# Patient Record
Sex: Female | Born: 1935 | Race: White | Hispanic: No | Marital: Married | Smoking: Never smoker
Health system: Southern US, Community
[De-identification: ages and names within clinical notes are randomized; demographics above are authoritative.]

## PROBLEM LIST (undated history)

## (undated) DIAGNOSIS — I10 Essential (primary) hypertension: Secondary | ICD-10-CM

## (undated) DIAGNOSIS — E669 Obesity, unspecified: Secondary | ICD-10-CM

## (undated) DIAGNOSIS — E119 Type 2 diabetes mellitus without complications: Secondary | ICD-10-CM

## (undated) DIAGNOSIS — Z9221 Personal history of antineoplastic chemotherapy: Secondary | ICD-10-CM

## (undated) DIAGNOSIS — E7800 Pure hypercholesterolemia, unspecified: Secondary | ICD-10-CM

## (undated) DIAGNOSIS — Z923 Personal history of irradiation: Secondary | ICD-10-CM

## (undated) DIAGNOSIS — C50919 Malignant neoplasm of unspecified site of unspecified female breast: Secondary | ICD-10-CM

## (undated) HISTORY — DX: Pure hypercholesterolemia, unspecified: E78.00

## (undated) HISTORY — DX: Obesity, unspecified: E66.9

## (undated) HISTORY — DX: Malignant neoplasm of unspecified site of unspecified female breast: C50.919

## (undated) HISTORY — DX: Type 2 diabetes mellitus without complications: E11.9

## (undated) HISTORY — DX: Essential (primary) hypertension: I10

---
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
## 1981-09-18 HISTORY — PX: VAGINAL HYSTERECTOMY: SUR661

## 1996-09-18 DIAGNOSIS — C50919 Malignant neoplasm of unspecified site of unspecified female breast: Secondary | ICD-10-CM

## 1996-09-18 HISTORY — DX: Malignant neoplasm of unspecified site of unspecified female breast: C50.919

## 1996-09-18 HISTORY — PX: BREAST LUMPECTOMY: SHX2

## 1996-11-16 HISTORY — PX: BREAST LUMPECTOMY: SHX2

## 1997-11-24 ENCOUNTER — Ambulatory Visit (HOSPITAL_COMMUNITY): Admission: RE | Admit: 1997-11-24 | Discharge: 1997-11-24 | Payer: Self-pay | Admitting: Hematology & Oncology

## 1998-06-28 ENCOUNTER — Ambulatory Visit (HOSPITAL_COMMUNITY): Admission: RE | Admit: 1998-06-28 | Discharge: 1998-06-28 | Payer: Self-pay | Admitting: Surgery

## 1998-12-08 ENCOUNTER — Encounter: Payer: Self-pay | Admitting: Surgery

## 1998-12-08 ENCOUNTER — Ambulatory Visit (HOSPITAL_COMMUNITY): Admission: RE | Admit: 1998-12-08 | Discharge: 1998-12-08 | Payer: Self-pay | Admitting: Surgery

## 1999-01-18 ENCOUNTER — Ambulatory Visit (HOSPITAL_COMMUNITY): Admission: RE | Admit: 1999-01-18 | Discharge: 1999-01-18 | Payer: Self-pay | Admitting: Surgery

## 1999-01-18 ENCOUNTER — Encounter: Payer: Self-pay | Admitting: Surgery

## 1999-08-19 ENCOUNTER — Other Ambulatory Visit: Admission: RE | Admit: 1999-08-19 | Discharge: 1999-08-19 | Payer: Self-pay | Admitting: *Deleted

## 1999-08-24 ENCOUNTER — Encounter: Payer: Self-pay | Admitting: Hematology & Oncology

## 1999-08-24 ENCOUNTER — Ambulatory Visit (HOSPITAL_COMMUNITY): Admission: RE | Admit: 1999-08-24 | Discharge: 1999-08-24 | Payer: Self-pay | Admitting: Hematology & Oncology

## 1999-11-15 ENCOUNTER — Ambulatory Visit (HOSPITAL_COMMUNITY): Admission: RE | Admit: 1999-11-15 | Discharge: 1999-11-15 | Payer: Self-pay | Admitting: Gastroenterology

## 1999-11-15 ENCOUNTER — Encounter (INDEPENDENT_AMBULATORY_CARE_PROVIDER_SITE_OTHER): Payer: Self-pay | Admitting: Specialist

## 2000-05-02 ENCOUNTER — Ambulatory Visit (HOSPITAL_COMMUNITY): Admission: RE | Admit: 2000-05-02 | Discharge: 2000-05-02 | Payer: Self-pay | Admitting: Hematology & Oncology

## 2000-08-28 ENCOUNTER — Encounter: Payer: Self-pay | Admitting: Hematology & Oncology

## 2000-08-28 ENCOUNTER — Encounter: Admission: RE | Admit: 2000-08-28 | Discharge: 2000-08-28 | Payer: Self-pay | Admitting: Hematology & Oncology

## 2000-10-25 ENCOUNTER — Other Ambulatory Visit: Admission: RE | Admit: 2000-10-25 | Discharge: 2000-10-25 | Payer: Self-pay | Admitting: Internal Medicine

## 2000-11-07 ENCOUNTER — Encounter: Admission: RE | Admit: 2000-11-07 | Discharge: 2000-11-07 | Payer: Self-pay | Admitting: Hematology & Oncology

## 2000-11-07 ENCOUNTER — Encounter: Payer: Self-pay | Admitting: Hematology & Oncology

## 2000-11-13 ENCOUNTER — Encounter: Payer: Self-pay | Admitting: Hematology & Oncology

## 2000-11-13 ENCOUNTER — Encounter: Admission: RE | Admit: 2000-11-13 | Discharge: 2000-11-13 | Payer: Self-pay | Admitting: Hematology & Oncology

## 2001-09-02 ENCOUNTER — Encounter: Payer: Self-pay | Admitting: Hematology & Oncology

## 2001-09-02 ENCOUNTER — Encounter: Admission: RE | Admit: 2001-09-02 | Discharge: 2001-09-02 | Payer: Self-pay | Admitting: Hematology & Oncology

## 2002-01-27 ENCOUNTER — Encounter (INDEPENDENT_AMBULATORY_CARE_PROVIDER_SITE_OTHER): Payer: Self-pay | Admitting: Specialist

## 2002-01-27 ENCOUNTER — Ambulatory Visit (HOSPITAL_COMMUNITY): Admission: RE | Admit: 2002-01-27 | Discharge: 2002-01-27 | Payer: Self-pay | Admitting: Gastroenterology

## 2002-09-04 ENCOUNTER — Encounter: Admission: RE | Admit: 2002-09-04 | Discharge: 2002-09-04 | Payer: Self-pay | Admitting: Hematology & Oncology

## 2002-09-04 ENCOUNTER — Encounter: Payer: Self-pay | Admitting: Hematology & Oncology

## 2002-11-18 ENCOUNTER — Encounter: Payer: Self-pay | Admitting: Hematology & Oncology

## 2002-11-18 ENCOUNTER — Ambulatory Visit (HOSPITAL_COMMUNITY): Admission: RE | Admit: 2002-11-18 | Discharge: 2002-11-18 | Payer: Self-pay | Admitting: Hematology & Oncology

## 2003-09-07 ENCOUNTER — Encounter: Admission: RE | Admit: 2003-09-07 | Discharge: 2003-09-07 | Payer: Self-pay | Admitting: Hematology & Oncology

## 2004-09-28 ENCOUNTER — Encounter: Admission: RE | Admit: 2004-09-28 | Discharge: 2004-09-28 | Payer: Self-pay | Admitting: Internal Medicine

## 2004-10-13 ENCOUNTER — Encounter: Admission: RE | Admit: 2004-10-13 | Discharge: 2004-10-13 | Payer: Self-pay | Admitting: Hematology & Oncology

## 2004-11-11 ENCOUNTER — Ambulatory Visit: Payer: Self-pay | Admitting: Hematology & Oncology

## 2004-11-23 ENCOUNTER — Ambulatory Visit (HOSPITAL_COMMUNITY): Admission: RE | Admit: 2004-11-23 | Discharge: 2004-11-23 | Payer: Self-pay | Admitting: Hematology & Oncology

## 2005-02-21 ENCOUNTER — Encounter: Admission: RE | Admit: 2005-02-21 | Discharge: 2005-02-21 | Payer: Self-pay | Admitting: Internal Medicine

## 2005-05-12 ENCOUNTER — Ambulatory Visit: Payer: Self-pay | Admitting: Hematology & Oncology

## 2005-10-17 ENCOUNTER — Encounter: Admission: RE | Admit: 2005-10-17 | Discharge: 2005-10-17 | Payer: Self-pay | Admitting: Internal Medicine

## 2005-11-10 ENCOUNTER — Ambulatory Visit: Payer: Self-pay | Admitting: Hematology & Oncology

## 2006-01-17 ENCOUNTER — Encounter: Payer: Self-pay | Admitting: Surgery

## 2006-05-14 ENCOUNTER — Ambulatory Visit: Payer: Self-pay | Admitting: Hematology & Oncology

## 2006-05-16 LAB — CBC WITH DIFFERENTIAL/PLATELET
BASO%: 0.5 % (ref 0.0–2.0)
Basophils Absolute: 0 10*3/uL (ref 0.0–0.1)
EOS%: 3.9 % (ref 0.0–7.0)
HGB: 11.9 g/dL (ref 11.6–15.9)
LYMPH%: 31.8 % (ref 14.0–48.0)
MONO%: 9.8 % (ref 0.0–13.0)
NEUT#: 3.3 10*3/uL (ref 1.5–6.5)
NEUT%: 54 % (ref 39.6–76.8)
WBC: 6 10*3/uL (ref 3.9–10.0)
lymph#: 1.9 10*3/uL (ref 0.9–3.3)

## 2006-05-16 LAB — COMPREHENSIVE METABOLIC PANEL
Alkaline Phosphatase: 65 U/L (ref 39–117)
BUN: 15 mg/dL (ref 6–23)
CO2: 28 mEq/L (ref 19–32)
Creatinine, Ser: 0.89 mg/dL (ref 0.40–1.20)
Glucose, Bld: 101 mg/dL — ABNORMAL HIGH (ref 70–99)
Sodium: 140 mEq/L (ref 135–145)
Total Bilirubin: 0.3 mg/dL (ref 0.3–1.2)
Total Protein: 6.9 g/dL (ref 6.0–8.3)

## 2006-10-18 ENCOUNTER — Encounter: Admission: RE | Admit: 2006-10-18 | Discharge: 2006-10-18 | Payer: Self-pay | Admitting: Hematology & Oncology

## 2006-11-12 ENCOUNTER — Ambulatory Visit: Payer: Self-pay | Admitting: Hematology & Oncology

## 2006-11-14 LAB — CBC WITH DIFFERENTIAL/PLATELET
Basophils Absolute: 0.1 10*3/uL (ref 0.0–0.1)
Eosinophils Absolute: 0.3 10*3/uL (ref 0.0–0.5)
HGB: 13.6 g/dL (ref 11.6–15.9)
LYMPH%: 33.1 % (ref 14.0–48.0)
MCV: 89.1 fL (ref 81.0–101.0)
MONO#: 0.7 10*3/uL (ref 0.1–0.9)
NEUT#: 4.2 10*3/uL (ref 1.5–6.5)
Platelets: 250 10*3/uL (ref 145–400)
RBC: 4.3 10*6/uL (ref 3.70–5.32)
WBC: 7.8 10*3/uL (ref 3.9–10.0)

## 2006-11-14 LAB — COMPREHENSIVE METABOLIC PANEL
Albumin: 4.5 g/dL (ref 3.5–5.2)
BUN: 10 mg/dL (ref 6–23)
CO2: 24 mEq/L (ref 19–32)
Glucose, Bld: 116 mg/dL — ABNORMAL HIGH (ref 70–99)
Potassium: 4.4 mEq/L (ref 3.5–5.3)
Sodium: 139 mEq/L (ref 135–145)
Total Bilirubin: 0.5 mg/dL (ref 0.3–1.2)
Total Protein: 7.2 g/dL (ref 6.0–8.3)

## 2007-05-12 ENCOUNTER — Ambulatory Visit: Payer: Self-pay | Admitting: Hematology & Oncology

## 2007-11-01 ENCOUNTER — Encounter: Admission: RE | Admit: 2007-11-01 | Discharge: 2007-11-01 | Payer: Self-pay | Admitting: Hematology & Oncology

## 2007-11-04 ENCOUNTER — Ambulatory Visit: Payer: Self-pay | Admitting: Hematology & Oncology

## 2007-11-06 LAB — CBC WITH DIFFERENTIAL/PLATELET
Basophils Absolute: 0 10*3/uL (ref 0.0–0.1)
EOS%: 4.6 % (ref 0.0–7.0)
Eosinophils Absolute: 0.3 10*3/uL (ref 0.0–0.5)
HGB: 13.4 g/dL (ref 11.6–15.9)
LYMPH%: 33.5 % (ref 14.0–48.0)
MCH: 30.9 pg (ref 26.0–34.0)
MCV: 90 fL (ref 81.0–101.0)
MONO#: 0.6 10*3/uL (ref 0.1–0.9)
NEUT%: 53.7 % (ref 39.6–76.8)
RBC: 4.34 10*6/uL (ref 3.70–5.32)
RDW: 13.1 % (ref 11.3–14.5)

## 2007-11-06 LAB — COMPREHENSIVE METABOLIC PANEL
ALT: 31 U/L (ref 0–35)
AST: 30 U/L (ref 0–37)
BUN: 15 mg/dL (ref 6–23)
Calcium: 9.4 mg/dL (ref 8.4–10.5)
Chloride: 100 mEq/L (ref 96–112)
Creatinine, Ser: 0.83 mg/dL (ref 0.40–1.20)
Potassium: 4 mEq/L (ref 3.5–5.3)
Total Bilirubin: 0.4 mg/dL (ref 0.3–1.2)
Total Protein: 6.8 g/dL (ref 6.0–8.3)

## 2008-05-13 ENCOUNTER — Ambulatory Visit: Payer: Self-pay | Admitting: Hematology & Oncology

## 2008-05-14 LAB — CBC WITH DIFFERENTIAL (CANCER CENTER ONLY)
BASO#: 0 10*3/uL (ref 0.0–0.2)
EOS%: 5.9 % (ref 0.0–7.0)
Eosinophils Absolute: 0.3 10*3/uL (ref 0.0–0.5)
HGB: 13.4 g/dL (ref 11.6–15.9)
LYMPH%: 38.9 % (ref 14.0–48.0)
MCH: 30.5 pg (ref 26.0–34.0)
MCHC: 35 g/dL (ref 32.0–36.0)
MCV: 87 fL (ref 81–101)
MONO%: 7.4 % (ref 0.0–13.0)
RBC: 4.39 10*6/uL (ref 3.70–5.32)

## 2008-05-14 LAB — COMPREHENSIVE METABOLIC PANEL
ALT: 43 U/L — ABNORMAL HIGH (ref 0–35)
AST: 36 U/L (ref 0–37)
Albumin: 4.3 g/dL (ref 3.5–5.2)
Alkaline Phosphatase: 65 U/L (ref 39–117)
Calcium: 9.9 mg/dL (ref 8.4–10.5)
Creatinine, Ser: 0.82 mg/dL (ref 0.40–1.20)
Potassium: 4.7 mEq/L (ref 3.5–5.3)
Total Bilirubin: 0.4 mg/dL (ref 0.3–1.2)
Total Protein: 6.8 g/dL (ref 6.0–8.3)

## 2008-11-04 ENCOUNTER — Ambulatory Visit: Payer: Self-pay | Admitting: Hematology & Oncology

## 2008-11-05 ENCOUNTER — Ambulatory Visit (HOSPITAL_BASED_OUTPATIENT_CLINIC_OR_DEPARTMENT_OTHER): Admission: RE | Admit: 2008-11-05 | Discharge: 2008-11-05 | Payer: Self-pay | Admitting: Hematology & Oncology

## 2008-11-05 ENCOUNTER — Ambulatory Visit: Payer: Self-pay | Admitting: Diagnostic Radiology

## 2008-11-05 LAB — CBC WITH DIFFERENTIAL (CANCER CENTER ONLY)
BASO#: 0 10*3/uL (ref 0.0–0.2)
Eosinophils Absolute: 0.3 10*3/uL (ref 0.0–0.5)
HCT: 36.2 % (ref 34.8–46.6)
HGB: 11.9 g/dL (ref 11.6–15.9)
LYMPH%: 31 % (ref 14.0–48.0)
MCV: 90 fL (ref 81–101)
MONO#: 0.4 10*3/uL (ref 0.1–0.9)
NEUT%: 58.3 % (ref 39.6–80.0)
Platelets: 231 10*3/uL (ref 145–400)
RBC: 4.04 10*6/uL (ref 3.70–5.32)
WBC: 6.7 10*3/uL (ref 3.9–10.0)

## 2008-11-05 LAB — COMPREHENSIVE METABOLIC PANEL
AST: 39 U/L — ABNORMAL HIGH (ref 0–37)
Albumin: 4.3 g/dL (ref 3.5–5.2)
Calcium: 9.5 mg/dL (ref 8.4–10.5)
Glucose, Bld: 120 mg/dL — ABNORMAL HIGH (ref 70–99)
Potassium: 4.1 mEq/L (ref 3.5–5.3)
Sodium: 136 mEq/L (ref 135–145)
Total Protein: 6.9 g/dL (ref 6.0–8.3)

## 2008-12-02 ENCOUNTER — Encounter: Admission: RE | Admit: 2008-12-02 | Discharge: 2008-12-02 | Payer: Self-pay | Admitting: Hematology & Oncology

## 2009-05-05 ENCOUNTER — Ambulatory Visit: Payer: Self-pay | Admitting: Hematology & Oncology

## 2009-05-06 LAB — COMPREHENSIVE METABOLIC PANEL
AST: 32 U/L (ref 0–37)
Albumin: 4.3 g/dL (ref 3.5–5.2)
Chloride: 103 mEq/L (ref 96–112)
Creatinine, Ser: 0.85 mg/dL (ref 0.40–1.20)
Sodium: 139 mEq/L (ref 135–145)
Total Bilirubin: 0.4 mg/dL (ref 0.3–1.2)
Total Protein: 6.7 g/dL (ref 6.0–8.3)

## 2009-05-06 LAB — CBC WITH DIFFERENTIAL (CANCER CENTER ONLY)
Eosinophils Absolute: 0.3 10*3/uL (ref 0.0–0.5)
HGB: 13.2 g/dL (ref 11.6–15.9)
LYMPH#: 2.6 10*3/uL (ref 0.9–3.3)
MONO#: 0.4 10*3/uL (ref 0.1–0.9)
NEUT#: 3.4 10*3/uL (ref 1.5–6.5)
Platelets: 253 10*3/uL (ref 145–400)
RBC: 4.33 10*6/uL (ref 3.70–5.32)
WBC: 6.7 10*3/uL (ref 3.9–10.0)

## 2010-03-31 ENCOUNTER — Encounter: Admission: RE | Admit: 2010-03-31 | Discharge: 2010-03-31 | Payer: Self-pay | Admitting: Hematology & Oncology

## 2010-05-04 ENCOUNTER — Ambulatory Visit: Payer: Self-pay | Admitting: Hematology & Oncology

## 2010-05-05 LAB — CBC WITH DIFFERENTIAL (CANCER CENTER ONLY)
HCT: 38.1 % (ref 34.8–46.6)
MCHC: 34.1 g/dL (ref 32.0–36.0)
MCV: 91 fL (ref 81–101)
RBC: 4.21 10*6/uL (ref 3.70–5.32)
RDW: 11.3 % (ref 10.5–14.6)
WBC: 6.5 10*3/uL (ref 3.9–10.0)

## 2010-05-06 LAB — COMPREHENSIVE METABOLIC PANEL
ALT: 32 U/L (ref 0–35)
AST: 34 U/L (ref 0–37)
Alkaline Phosphatase: 60 U/L (ref 39–117)
BUN: 13 mg/dL (ref 6–23)
Chloride: 101 mEq/L (ref 96–112)
Creatinine, Ser: 0.91 mg/dL (ref 0.40–1.20)
Glucose, Bld: 97 mg/dL (ref 70–99)
Potassium: 4.1 mEq/L (ref 3.5–5.3)
Total Bilirubin: 0.4 mg/dL (ref 0.3–1.2)

## 2011-02-03 NOTE — Procedures (Signed)
[place]. [facility]  Patient:    Meghan Santos, Meghan Santos Visit Number: [id number] MRN: [id number]          Service Type: END Location: ENDO Attending Physician:  Rich Brave Dictated by:   Florencia Reasons, M.D. Proc. Date: 01/27/02 Admit Date:  01/27/2002   CC:         Rodrigo Ran, M.D.                           Procedure Report  PROCEDURE:  Upper endoscopy with biopsies.  SURGEON:  Florencia Reasons, M.D.  INDICATIONS:  A 76 year old female with mild anemia and recurring epigastric pain (which has subsequently resolved).  FINDINGS:  Small hiatal hernia.  Small gastric polyp.  DESCRIPTION OF PROCEDURE:  The nature, purpose, and risks of the procedure had been discussed with the patient who provided written consent.  Sedation was fentanyl 100 mcg and Versed 10 mg IV without arrhythmias or desaturation.  The Olympus small caliber adult video endoscope was passed under direct vision, entering the esophagus without difficulty.  The esophageal mucosa was normal. There was no evidence of reflux esophagitis, Barretts esophagus, varices, infection, or neoplasia.  There was a 3 cm hiatal hernia present.  On the greater curve aspect of the mid body of the stomach was a 3 mm raised nodule, probably representing an early gastric polyp which was biopsied once or twice.  No large polyps, cancer, colitis, or vascular malformations were observed.  Retroflexion in the cardia of the stomach was basically unremarkable.  The pylorus, duodenal bulb, and second duodenum looked normal including the duodenal mucosa, but random biopsies were obtained in view of the history of anemia to rule out celiac disease.  The scope was then removed from the patient who tolerated the procedure well and without apparent complication.  IMPRESSION: 1. No source of epigastric pain or mild anemia endoscopically evident. 2. No evidence of aspirin-induced gastropathy. 3. Small  gastric polyp, doubtful clinical significance. 4. Small to medium-sized hiatal hernia.  PLAN:  Await pathology on biopsies. Dictated by:   Florencia Reasons, M.D. Attending Physician:  Rich Brave DD:  01/27/02 TD:  01/28/02 Job: [id number]

## 2011-04-05 ENCOUNTER — Other Ambulatory Visit: Payer: Self-pay | Admitting: Hematology & Oncology

## 2011-04-05 DIAGNOSIS — Z1231 Encounter for screening mammogram for malignant neoplasm of breast: Secondary | ICD-10-CM

## 2011-04-06 ENCOUNTER — Ambulatory Visit
Admission: RE | Admit: 2011-04-06 | Discharge: 2011-04-06 | Disposition: A | Discharge status: Home / Self Care | Payer: Medicare Other | Source: Ambulatory Visit | Attending: Hematology & Oncology | Admitting: Hematology & Oncology

## 2011-04-06 DIAGNOSIS — Z1231 Encounter for screening mammogram for malignant neoplasm of breast: Secondary | ICD-10-CM

## 2011-04-27 ENCOUNTER — Encounter (HOSPITAL_BASED_OUTPATIENT_CLINIC_OR_DEPARTMENT_OTHER): Payer: Medicare Other | Admitting: Hematology & Oncology

## 2011-04-27 ENCOUNTER — Other Ambulatory Visit: Payer: Self-pay | Admitting: Hematology & Oncology

## 2011-04-27 DIAGNOSIS — C50919 Malignant neoplasm of unspecified site of unspecified female breast: Secondary | ICD-10-CM

## 2011-04-27 DIAGNOSIS — L989 Disorder of the skin and subcutaneous tissue, unspecified: Secondary | ICD-10-CM

## 2011-04-27 DIAGNOSIS — Z170 Estrogen receptor positive status [ER+]: Secondary | ICD-10-CM

## 2011-04-27 LAB — CBC WITH DIFFERENTIAL (CANCER CENTER ONLY)
BASO#: 0 10*3/uL (ref 0.0–0.2)
BASO%: 0.3 % (ref 0.0–2.0)
Eosinophils Absolute: 0.5 10*3/uL (ref 0.0–0.5)
HCT: 37 % (ref 34.8–46.6)
LYMPH#: 2.4 10*3/uL (ref 0.9–3.3)
MCH: 31.1 pg (ref 26.0–34.0)
MCV: 91 fL (ref 81–101)
MONO#: 0.5 10*3/uL (ref 0.1–0.9)
Platelets: 220 10*3/uL (ref 145–400)
RBC: 4.08 10*6/uL (ref 3.70–5.32)
RDW: 12.5 % (ref 11.1–15.7)

## 2011-04-27 LAB — COMPREHENSIVE METABOLIC PANEL
ALT: 20 U/L (ref 0–35)
AST: 30 U/L (ref 0–37)
BUN: 15 mg/dL (ref 6–23)
CO2: 29 mEq/L (ref 19–32)
Glucose, Bld: 105 mg/dL — ABNORMAL HIGH (ref 70–99)

## 2011-11-22 ENCOUNTER — Other Ambulatory Visit: Payer: Self-pay | Admitting: Gastroenterology

## 2011-11-22 ENCOUNTER — Other Ambulatory Visit (HOSPITAL_COMMUNITY): Payer: Self-pay | Admitting: *Deleted

## 2011-11-22 DIAGNOSIS — R059 Cough, unspecified: Secondary | ICD-10-CM

## 2011-11-22 DIAGNOSIS — R05 Cough: Secondary | ICD-10-CM

## 2011-11-23 ENCOUNTER — Encounter (INDEPENDENT_AMBULATORY_CARE_PROVIDER_SITE_OTHER): Payer: Medicare Other | Admitting: *Deleted

## 2011-11-23 DIAGNOSIS — R609 Edema, unspecified: Secondary | ICD-10-CM

## 2011-11-24 ENCOUNTER — Ambulatory Visit
Admission: RE | Admit: 2011-11-24 | Discharge: 2011-11-24 | Disposition: A | Discharge status: Home / Self Care | Payer: Medicare Other | Source: Ambulatory Visit | Attending: Gastroenterology | Admitting: Gastroenterology

## 2011-11-29 ENCOUNTER — Ambulatory Visit (HOSPITAL_COMMUNITY)
Admission: RE | Admit: 2011-11-29 | Discharge: 2011-11-29 | Disposition: A | Discharge status: Home / Self Care | Payer: Medicare Other | Source: Ambulatory Visit | Attending: Gastroenterology | Admitting: Gastroenterology

## 2011-11-29 DIAGNOSIS — K219 Gastro-esophageal reflux disease without esophagitis: Secondary | ICD-10-CM | POA: Insufficient documentation

## 2011-11-29 DIAGNOSIS — R059 Cough, unspecified: Secondary | ICD-10-CM

## 2011-11-29 DIAGNOSIS — R1310 Dysphagia, unspecified: Secondary | ICD-10-CM | POA: Insufficient documentation

## 2011-11-29 DIAGNOSIS — R05 Cough: Secondary | ICD-10-CM | POA: Insufficient documentation

## 2011-11-29 DIAGNOSIS — K449 Diaphragmatic hernia without obstruction or gangrene: Secondary | ICD-10-CM | POA: Insufficient documentation

## 2011-11-29 NOTE — Procedures (Signed)
Modified Barium Swallow Procedure Note Patient Details  Name: Meghan Santos MRN: [id number] Date of Birth: Feb 20, 1935  Today's Date: 11/29/2011  Past Medical History: No past medical history on file. Past Surgical History: No past surgical history on file.  HPI: 77 yo female with a h/o GERD and a hiatal hernia. She denies any h/o PNA or CVA. Pt reports that she is coughing on initial sips of thin liquid, but solid consistencies do not cause her any trouble. An MBS was scheduled as well as a barium swallow, which revealed mild reflux and a small hiatal hernia.  Recommendation/Prognosis  Clinical Impression: Pt presents with an oropharyngeal swallow that is age appropriate. A delay to the pyriform sinuses was observed with thin liquids, resulting in flash penetration, however no aspiration was observed and all penetrates were spontaneously cleared. Recommend a regular diet with thin liquids and reflux precautions give h/o GERD and hiatal hernia.  Recommendations: 1. Regular diet with thin liquids 2. Slow rate, small bites/sips 3. Meds whole in liquid 4. Alternate solids/liquids 5. Remain upright 30-60 minutes after meals   Maxcine Ham 11/29/2011, 4:18 PM   Maxcine Ham, SLP Student

## 2011-12-04 NOTE — Procedures (Unsigned)
DUPLEX DEEP VENOUS EXAM - LOWER EXTREMITY  INDICATION:  Bilateral lower extremity edema for 4 months.  HISTORY:  Edema:  Yes Trauma/Surgery:  No Pain:  No PE:  No Previous DVT:  No Anticoagulants:  Aspirin Other:  DUPLEX EXAM:               CFV   SFV   PopV  PTV    GSV               R  L  R  L  R  L  R   L  R  L Thrombosis    0  0  0  0  0  0  0   0  0  0 Spontaneous   +  +  +  +  +  +            + Phasic        +  +  +  +  +  +            + Augmentation  +  +  +  +  +  +  +   +  +  + Compressible  +  +  +  +  +  +  +   +  +  + Competent     +  +  +  +  +  +  +   +  +  0  Legend:  + - yes  o - no  p - partial  D - decreased  IMPRESSION: 1. No evidence of deep venous thrombosis or superficial venous     thrombus in the bilateral lower extremities. 2. There is mild reflux observed in the left great saphenous vein.   _____________________________ V. Charlena Cross, MD  LT/MEDQ  D:  11/23/2011  T:  11/23/2011  Job:  [id number]

## 2012-01-19 ENCOUNTER — Other Ambulatory Visit: Payer: Self-pay | Admitting: Gastroenterology

## 2012-04-29 ENCOUNTER — Ambulatory Visit (HOSPITAL_BASED_OUTPATIENT_CLINIC_OR_DEPARTMENT_OTHER): Payer: Medicare Other | Admitting: Hematology & Oncology

## 2012-04-29 ENCOUNTER — Other Ambulatory Visit (HOSPITAL_BASED_OUTPATIENT_CLINIC_OR_DEPARTMENT_OTHER): Payer: Medicare Other | Admitting: Lab

## 2012-04-29 VITALS — BP 121/72 | HR 67 | Temp 97.0°F | Resp 18 | Ht 66.0 in | Wt 197.0 lb

## 2012-04-29 DIAGNOSIS — M810 Age-related osteoporosis without current pathological fracture: Secondary | ICD-10-CM

## 2012-04-29 DIAGNOSIS — Z853 Personal history of malignant neoplasm of breast: Secondary | ICD-10-CM

## 2012-04-29 DIAGNOSIS — C50919 Malignant neoplasm of unspecified site of unspecified female breast: Secondary | ICD-10-CM

## 2012-04-29 LAB — CBC WITH DIFFERENTIAL (CANCER CENTER ONLY)
BASO%: 0.4 % (ref 0.0–2.0)
Eosinophils Absolute: 0.7 10*3/uL — ABNORMAL HIGH (ref 0.0–0.5)
HCT: 38.7 % (ref 34.8–46.6)
LYMPH#: 2.6 10*3/uL (ref 0.9–3.3)
MONO#: 0.7 10*3/uL (ref 0.1–0.9)
Platelets: 248 10*3/uL (ref 145–400)
RBC: 4.33 10*6/uL (ref 3.70–5.32)
RDW: 13.6 % (ref 11.1–15.7)
WBC: 7.7 10*3/uL (ref 3.9–10.0)

## 2012-04-29 NOTE — Progress Notes (Signed)
This office note has been dictated.

## 2012-04-30 LAB — VITAMIN D 25 HYDROXY (VIT D DEFICIENCY, FRACTURES): Vit D, 25-Hydroxy: 48 ng/mL (ref 30–89)

## 2012-04-30 LAB — COMPREHENSIVE METABOLIC PANEL
ALT: 31 U/L (ref 0–35)
BUN: 11 mg/dL (ref 6–23)
CO2: 28 mEq/L (ref 19–32)
Calcium: 9.9 mg/dL (ref 8.4–10.5)
Chloride: 103 mEq/L (ref 96–112)
Creatinine, Ser: 0.82 mg/dL (ref 0.50–1.10)
Glucose, Bld: 97 mg/dL (ref 70–99)

## 2012-04-30 NOTE — Progress Notes (Signed)
CC:   Mark A. Perini, M.D. Frederick A. Worthy Rancher, M.D.  DIAGNOSIS:  Stage IIB (T2 N1 M0) lobular carcinoma of the left breast.  CURRENT THERAPY:  Observation.  INTERIM HISTORY:  Meghan Santos comes in for her yearly followup.  She is doing quite well.  She has had no problems since we last saw her.  She does have diabetes.  She says this is fairly well-controlled.  She has not had a mammogram yet.  She really does not have a lot of faith in mammograms.  I told her that she needed to have her mammogram done.  She has had some slight leg swelling.  This comes and goes.  There has been no change in bowel or bladder habits.  She has had no bleeding. She has had no cough or shortness of breath.  PHYSICAL EXAMINATION:  This is a well-developed, well-nourished white female in no obvious distress.  Vital signs:  Temperature of 97, pulse 67, respiratory rate 18, blood pressure 121/72.  Weight is 197.  Head and neck:  Normocephalic, atraumatic skull.  There are no ocular or oral lesions.  There are no palpable cervical or supraclavicular lymph nodes. Lungs:  Clear bilaterally.  Cardiac:  Regular rate and rhythm with a normal S1 and S2.  There are no murmurs, rubs or bruits.  Abdomen:  Soft with good bowel sounds.  There is no palpable abdominal mass.  There is no fluid wave.  There is no palpable hepatosplenomegaly.  Back:  Right breast with no masses, edema or erythema.  There is no right axillary adenopathy.  Left breast shows well-healed lumpectomy at the 5 o'clock position.  This is right at the areola rim.  There is some slight swelling and fullness at the lumpectomy site.  No distinct mass is noted at the lumpectomy site.  There is no mass in the left breast.  There is no left axillary adenopathy.  Back:  Some mild kyphosis.  No tenderness is noted over the spine, ribs or hips.  Extremities:  No clubbing, cyanosis or edema.  Neurologic:  No focal neurological deficit.  Skin: No rashes,  ecchymosis or petechia.  LABORATORY STUDIES:  White cell count is 7.7, hemoglobin 12.7, hematocrit 38.7, platelet count 248.  IMPRESSION:  Meghan Santos is a 77 year old white female with stage IIB breast cancer.  She was treated back in 1998.  She did very, very well. She had 1 positive lymph node.  She was on Arimidex that was completed back in 2008.  We will go ahead and plan to get her back in 1 year.    ______________________________ Josph Macho, M.D. PRE/MEDQ  D:  04/29/2012  T:  04/30/2012  Job:  [id number]

## 2012-06-24 ENCOUNTER — Other Ambulatory Visit: Payer: Self-pay | Admitting: Hematology & Oncology

## 2012-06-24 DIAGNOSIS — Z1231 Encounter for screening mammogram for malignant neoplasm of breast: Secondary | ICD-10-CM

## 2012-07-02 ENCOUNTER — Ambulatory Visit
Admission: RE | Admit: 2012-07-02 | Discharge: 2012-07-02 | Disposition: A | Discharge status: Home / Self Care | Payer: Medicare Other | Source: Ambulatory Visit | Attending: Hematology & Oncology | Admitting: Hematology & Oncology

## 2012-07-02 DIAGNOSIS — Z1231 Encounter for screening mammogram for malignant neoplasm of breast: Secondary | ICD-10-CM

## 2013-04-14 ENCOUNTER — Telehealth: Payer: Self-pay | Admitting: Hematology & Oncology

## 2013-04-14 NOTE — Telephone Encounter (Signed)
Patient called and cx 04/28/13 and resch for 05/12/13

## 2013-04-28 ENCOUNTER — Other Ambulatory Visit: Payer: Medicare Other | Admitting: Lab

## 2013-04-28 ENCOUNTER — Ambulatory Visit: Payer: Medicare Other | Admitting: Hematology & Oncology

## 2013-05-12 ENCOUNTER — Ambulatory Visit (HOSPITAL_BASED_OUTPATIENT_CLINIC_OR_DEPARTMENT_OTHER): Payer: Medicare Other | Admitting: Hematology & Oncology

## 2013-05-12 ENCOUNTER — Ambulatory Visit (HOSPITAL_BASED_OUTPATIENT_CLINIC_OR_DEPARTMENT_OTHER): Payer: Medicare Other | Admitting: Lab

## 2013-05-12 DIAGNOSIS — Z853 Personal history of malignant neoplasm of breast: Secondary | ICD-10-CM

## 2013-05-12 DIAGNOSIS — C50912 Malignant neoplasm of unspecified site of left female breast: Secondary | ICD-10-CM

## 2013-05-12 LAB — CBC WITH DIFFERENTIAL (CANCER CENTER ONLY)
BASO#: 0 10*3/uL (ref 0.0–0.2)
BASO%: 0.5 % (ref 0.0–2.0)
EOS%: 3.8 % (ref 0.0–7.0)
HGB: 12.2 g/dL (ref 11.6–15.9)
LYMPH#: 3 10*3/uL (ref 0.9–3.3)
MCHC: 32.6 g/dL (ref 32.0–36.0)
MONO#: 0.6 10*3/uL (ref 0.1–0.9)
NEUT#: 3.5 10*3/uL (ref 1.5–6.5)
WBC: 7.4 10*3/uL (ref 3.9–10.0)

## 2013-05-12 LAB — CMP (CANCER CENTER ONLY)
AST: 60 U/L — ABNORMAL HIGH (ref 11–38)
Alkaline Phosphatase: 56 U/L (ref 26–84)
BUN, Bld: 10 mg/dL (ref 7–22)
Creat: 0.9 mg/dl (ref 0.6–1.2)
Total Bilirubin: 0.5 mg/dl (ref 0.20–1.60)

## 2013-05-12 NOTE — Progress Notes (Signed)
This office note has been dictated.

## 2013-05-13 NOTE — Progress Notes (Signed)
CC:   Mark A. Perini, M.D. Frederick A. Worthy Rancher, M.D.  DIAGNOSIS:  Stage IIB (T2 N1 M0) lobular carcinoma of the left breast.  CURRENT THERAPY:  Observation.  INTERIM HISTORY:  Ms. Benda comes in for followup.  She is doing well. We see her yearly.  She is slowing down a little bit.  She has lost a little weight.  She is happy about this.  She lost a little more weight.  She and her family will be going to the beach over Labor Day.  They always do this.  Overall, she has been doing fairly well.  She does have diabetes.  She says this has not been much of a problem for her.  She has had no problems with pain.  There has been no issues with cough or shortness of breath.  She has had no rashes.  She does not have a lot of faith in mammograms, although she does get these done.  Her last mammogram was done back in October 2013.  This looked fine with no suspicious calcifications.  PHYSICAL EXAMINATION:  General:  This is a well-developed, well- nourished white female in no obvious distress. Vital signs:  Temperature 98, pulse 69, respiratory rate 16, blood pressure 128/53.  Weight is 188. Head and neck:  Shows normocephalic, atraumatic skull.  There are no ocular or oral lesions.  There are no palpable cervical or supraclavicular lymph nodes. Lungs:  Clear bilaterally. Cardiac:  Regular rate and rhythm with normal S1 and S2.  There are no murmurs, rubs or bruits. Abdomen:  Soft.  She has good bowel sounds.  There is no palpable abdominal mass.  There is no palpable hepatosplenomegaly. Breasts:  Show right breast with no masses, edema or erythema.  There is no right axillary adenopathy.  Left breast shows well-healed lumpectomy in the 5 o'clock position.  There is no distinct mass in the right breast.  There is no right axillary adenopathy. Back:  Shows no kyphosis.  There is no tenderness over the spine, ribs, or hips.  Extremities:  Show no clubbing, cyanosis or edema.   No lymphedema is noted in the right arm. Skin:  Shows no rashes, ecchymosis, or petechia.  LABORATORY STUDIES:  White cell count is 7.4, hemoglobin 12.2, hematocrit 37.4, platelet count 227.  IMPRESSION:  Ms. Samad is a very charming 78 year old white female with history of stage IIB lobular carcinoma of the left breast.  This was diagnosed back in 1998.  She had a positive lymph node.  She underwent systemic chemotherapy.  She was then placed on Arimidex.  She complete Arimidex in 2008.  I do not see any evidence of recurrence.  As such, we will plan to get her back in 1 year.  I do not see a need for any additional scans.    ______________________________ Josph Macho, M.D. PRE/MEDQ  D:  05/12/2013  T:  05/13/2013  Job:  [id number]

## 2013-05-15 ENCOUNTER — Telehealth: Payer: Self-pay | Admitting: Oncology

## 2013-05-15 NOTE — Telephone Encounter (Addendum)
Message copied by Lacie Draft on Thu May 15, 2013  4:49 PM ------      Message from: Arlan Organ R      Created: Wed May 14, 2013  9:08 PM       Call - labs look ok!!  Liver tests mildly elevated - likely due to medications.  Need your family MD to f/u with this.  Pete ------Left message on voice mail.

## 2013-07-28 ENCOUNTER — Other Ambulatory Visit: Payer: Self-pay

## 2013-07-28 DIAGNOSIS — Z1231 Encounter for screening mammogram for malignant neoplasm of breast: Secondary | ICD-10-CM

## 2013-08-27 ENCOUNTER — Ambulatory Visit
Admission: RE | Admit: 2013-08-27 | Discharge: 2013-08-27 | Disposition: A | Discharge status: Home / Self Care | Payer: Medicare Other | Source: Ambulatory Visit

## 2013-08-27 DIAGNOSIS — Z1231 Encounter for screening mammogram for malignant neoplasm of breast: Secondary | ICD-10-CM

## 2014-05-13 ENCOUNTER — Other Ambulatory Visit: Payer: Self-pay | Admitting: *Deleted

## 2014-05-13 DIAGNOSIS — C50912 Malignant neoplasm of unspecified site of left female breast: Secondary | ICD-10-CM

## 2014-05-14 ENCOUNTER — Encounter: Payer: Self-pay | Admitting: Hematology & Oncology

## 2014-05-14 ENCOUNTER — Other Ambulatory Visit (HOSPITAL_BASED_OUTPATIENT_CLINIC_OR_DEPARTMENT_OTHER): Payer: Medicare Other | Admitting: Lab

## 2014-05-14 ENCOUNTER — Ambulatory Visit (HOSPITAL_BASED_OUTPATIENT_CLINIC_OR_DEPARTMENT_OTHER): Payer: Medicare Other | Admitting: Hematology & Oncology

## 2014-05-14 VITALS — BP 122/74 | HR 75 | Temp 97.0°F | Resp 14 | Ht 65.0 in | Wt 193.0 lb

## 2014-05-14 DIAGNOSIS — Z853 Personal history of malignant neoplasm of breast: Secondary | ICD-10-CM

## 2014-05-14 DIAGNOSIS — C50912 Malignant neoplasm of unspecified site of left female breast: Secondary | ICD-10-CM

## 2014-05-14 LAB — CBC WITH DIFFERENTIAL (CANCER CENTER ONLY)
BASO#: 0 10*3/uL (ref 0.0–0.2)
BASO%: 0.4 % (ref 0.0–2.0)
EOS ABS: 0.2 10*3/uL (ref 0.0–0.5)
EOS%: 3 % (ref 0.0–7.0)
HCT: 36 % (ref 34.8–46.6)
HGB: 11.8 g/dL (ref 11.6–15.9)
LYMPH#: 2.8 10*3/uL (ref 0.9–3.3)
LYMPH%: 36.5 % (ref 14.0–48.0)
MCH: 29.2 pg (ref 26.0–34.0)
MCHC: 32.8 g/dL (ref 32.0–36.0)
MCV: 89 fL (ref 81–101)
MONO#: 0.7 10*3/uL (ref 0.1–0.9)
MONO%: 9 % (ref 0.0–13.0)
NEUT%: 51.1 % (ref 39.6–80.0)
NEUTROS ABS: 3.9 10*3/uL (ref 1.5–6.5)
PLATELETS: 265 10*3/uL (ref 145–400)
RBC: 4.04 10*6/uL (ref 3.70–5.32)
RDW: 13.7 % (ref 11.1–15.7)
WBC: 7.6 10*3/uL (ref 3.9–10.0)

## 2014-05-15 LAB — COMPREHENSIVE METABOLIC PANEL
ALBUMIN: 4.3 g/dL (ref 3.5–5.2)
ALT: 46 U/L — AB (ref 0–35)
AST: 54 U/L — AB (ref 0–37)
Alkaline Phosphatase: 63 U/L (ref 39–117)
BILIRUBIN TOTAL: 0.3 mg/dL (ref 0.2–1.2)
BUN: 10 mg/dL (ref 6–23)
CO2: 25 mEq/L (ref 19–32)
Calcium: 9.5 mg/dL (ref 8.4–10.5)
Chloride: 100 mEq/L (ref 96–112)
Creatinine, Ser: 0.78 mg/dL (ref 0.50–1.10)
Glucose, Bld: 154 mg/dL — ABNORMAL HIGH (ref 70–99)
POTASSIUM: 4.1 meq/L (ref 3.5–5.3)
Sodium: 136 mEq/L (ref 135–145)
TOTAL PROTEIN: 7 g/dL (ref 6.0–8.3)

## 2014-05-15 LAB — VITAMIN D 25 HYDROXY (VIT D DEFICIENCY, FRACTURES): Vit D, 25-Hydroxy: 48 ng/mL (ref 30–89)

## 2014-05-18 NOTE — Progress Notes (Signed)
Hematology and Oncology Follow Up Visit  Meghan Santos [id number] Apr 17, 1935 79 y.o. 05/18/2014   Principle Diagnosis:  Stage IIB (T2 N1 M0) lobular carcinoma of the left breast.  Current Therapy:    Observation     Interim History:  Meghan Santos is back for her yearly followup. She is very tired Ahmed Prima is some sludge today. I am very grateful for that.  She's been doing okay. Her blood sugars I think are on the high side., Sure how aggressive she is trying to help with those.  She did have cataract surgery back in March. There was a well well with this.  She's had no problems with bone pain. She's had no problems with changes in bowel or bladder habits. She's had no leg swelling. There's been no rashes. She's had no change in medications.  Her last mammogram was done in December of last year. Everything looked okay.  Medications: Current outpatient prescriptions:aspirin 81 MG tablet, Take 81 mg by mouth., Disp: , Rfl: ;  benazepril-hydrochlorthiazide (LOTENSIN HCT) 10-12.5 MG per tablet, daily. , Disp: , Rfl: ;  Biotin 5000 MCG TABS, Take by mouth every morning., Disp: , Rfl: ;  Cholecalciferol (VITAMIN D) 2000 UNITS tablet, Take 2,000 Units by mouth daily., Disp: , Rfl: ;  Coenzyme Q10 (CO Q-10) 100 MG CAPS, Take by mouth every morning., Disp: , Rfl:  CRESTOR 20 MG tablet, 20 mg daily. , Disp: , Rfl: ;  Cyanocobalamin (VITAMIN B 12 PO), Take by mouth every morning., Disp: , Rfl: ;  diltiazem (TIAZAC) 240 MG 24 hr capsule, daily. , Disp: , Rfl: ;  levothyroxine (SYNTHROID, LEVOTHROID) 125 MCG tablet, 125 mcg daily. , Disp: , Rfl: ;  LORazepam (ATIVAN) 1 MG tablet, at bedtime. , Disp: , Rfl:  metformin (FORTAMET) 1000 MG (OSM) 24 hr tablet, Take 1,000 mg by mouth daily with breakfast. , Disp: , Rfl: ;  Multiple Vitamin (MULTI-VITAMIN DAILY PO), Take by mouth every morning., Disp: , Rfl: ;  venlafaxine XR (EFFEXOR-XR) 150 MG 24 hr capsule, Take 150 mg by mouth daily with breakfast., Disp: ,  Rfl:   Allergies: No Known Allergies  Past Medical History, Surgical history, Social history, and Family History were reviewed and updated.  Review of Systems: As above  Physical Exam:  height is 5\' 5"  (1.651 m) and weight is 193 lb (87.544 kg). Her oral temperature is 97 F (36.1 C). Her blood pressure is 122/74 and her pulse is 75. Her respiration is 14.   Well-developed and well-nourished white female. Head and neck exam shows no ocular or oral lesions. Lungs are clear. Cardiac exam regular in rhythm with no murmurs rubs or bruits. Breast exam shows right breast with no masses, edema or erythema. There is no right axillary adenopathy. Left breast shows a well-healed lumpectomy the 5:00 position. There's some slight retraction of the left breast from radiation surgery. Some firmness is noted about the lumpectomy. No distinct masses noted. There is no right axillary adenopathy. Abdomen is soft. She's good bowel sounds. There is no palpable liver or spleen tip. Back exam shows no kyphosis. No tenderness is noted over the spine, ribs or hips. Extremities shows no clubbing, cyanosis or edema. Neurological exam shows no focal neurological deficits.  Lab Results  Component Value Date   WBC 7.6 05/14/2014   HGB 11.8 05/14/2014   HCT 36.0 05/14/2014   MCV 89 05/14/2014   PLT 265 05/14/2014     Chemistry  Component Value Date/Time   NA 136 05/14/2014 1428   NA 137 05/12/2013 1332   K 4.1 05/14/2014 1428   K 4.2 05/12/2013 1332   CL 100 05/14/2014 1428   CL 97* 05/12/2013 1332   CO2 25 05/14/2014 1428   CO2 28 05/12/2013 1332   BUN 10 05/14/2014 1428   BUN 10 05/12/2013 1332   CREATININE 0.78 05/14/2014 1428   CREATININE 0.9 05/12/2013 1332      Component Value Date/Time   CALCIUM 9.5 05/14/2014 1428   CALCIUM 9.4 05/12/2013 1332   ALKPHOS 63 05/14/2014 1428   ALKPHOS 56 05/12/2013 1332   AST 54* 05/14/2014 1428   AST 60* 05/12/2013 1332   ALT 46* 05/14/2014 1428   ALT 54* 05/12/2013 1332    BILITOT 0.3 05/14/2014 1428   BILITOT 0.50 05/12/2013 1332         Impression and Plan: Meghan Santos is 79 year old white female with a history of stage IIb lobular carcinoma the left breast. She was diagnosed back in 1998. She underwent systemic chemotherapy followed by Arimidex. She had radiation therapy.  She completed Arimidex when 2008.  I do not see any evidence of recurrent disease.  We will go ahead and plan to get her back in one or year.   [name], MD 8/31/20157:27 AM

## 2014-06-17 ENCOUNTER — Other Ambulatory Visit: Payer: Self-pay | Admitting: Dermatology

## 2014-07-28 ENCOUNTER — Other Ambulatory Visit: Payer: Self-pay

## 2014-07-28 DIAGNOSIS — Z1231 Encounter for screening mammogram for malignant neoplasm of breast: Secondary | ICD-10-CM

## 2014-07-28 DIAGNOSIS — Z853 Personal history of malignant neoplasm of breast: Secondary | ICD-10-CM

## 2014-08-28 ENCOUNTER — Ambulatory Visit
Admission: RE | Admit: 2014-08-28 | Discharge: 2014-08-28 | Disposition: A | Discharge status: Home / Self Care | Payer: Medicare Other | Source: Ambulatory Visit

## 2014-08-28 DIAGNOSIS — Z853 Personal history of malignant neoplasm of breast: Secondary | ICD-10-CM

## 2014-08-28 DIAGNOSIS — Z1231 Encounter for screening mammogram for malignant neoplasm of breast: Secondary | ICD-10-CM

## 2014-08-31 ENCOUNTER — Telehealth: Payer: Self-pay | Admitting: Cardiovascular Disease

## 2014-08-31 NOTE — Telephone Encounter (Signed)
Received records from [facility] (Dr Crist Infante) for appointment on 09/03/14 with Dr Sallyanne Kuster.  Records given to [facility] (medical records) for Dr Croitoru's schedule on 09/03/14.  lp

## 2014-09-03 ENCOUNTER — Ambulatory Visit (INDEPENDENT_AMBULATORY_CARE_PROVIDER_SITE_OTHER): Payer: Medicare Other | Admitting: Cardiovascular Disease

## 2014-09-03 ENCOUNTER — Encounter: Payer: Self-pay | Admitting: Cardiovascular Disease

## 2014-09-03 VITALS — BP 112/72 | HR 75 | Resp 18 | Ht 65.0 in | Wt 192.2 lb

## 2014-09-03 DIAGNOSIS — I10 Essential (primary) hypertension: Secondary | ICD-10-CM

## 2014-09-03 DIAGNOSIS — R0609 Other forms of dyspnea: Secondary | ICD-10-CM

## 2014-09-03 DIAGNOSIS — E669 Obesity, unspecified: Secondary | ICD-10-CM

## 2014-09-03 DIAGNOSIS — E7800 Pure hypercholesterolemia, unspecified: Secondary | ICD-10-CM

## 2014-09-03 DIAGNOSIS — R0600 Dyspnea, unspecified: Secondary | ICD-10-CM

## 2014-09-03 NOTE — Patient Instructions (Signed)
Your physician has requested that you have an echocardiogram. Echocardiography is a painless test that uses sound waves to create images of your heart. It provides your doctor with information about the size and shape of your heart and how well your heart's chambers and valves are working. This procedure takes approximately one hour. There are no restrictions for this procedure.  Your physician has requested that you have en exercise stress myoview. For further information please visit [email or web address]. Please follow instruction sheet, as given.  Dr. Sallyanne Kuster recommends that you schedule a follow-up appointment in: after testing is complete to go over your test result.  We will call you with the results if there is a delay in your appointment with Dr. Sallyanne Kuster.

## 2014-09-05 ENCOUNTER — Encounter: Payer: Self-pay | Admitting: Cardiovascular Disease

## 2014-09-05 DIAGNOSIS — E669 Obesity, unspecified: Secondary | ICD-10-CM

## 2014-09-05 DIAGNOSIS — R0609 Other forms of dyspnea: Secondary | ICD-10-CM

## 2014-09-05 DIAGNOSIS — R0600 Dyspnea, unspecified: Secondary | ICD-10-CM | POA: Insufficient documentation

## 2014-09-05 DIAGNOSIS — E7800 Pure hypercholesterolemia, unspecified: Secondary | ICD-10-CM

## 2014-09-05 DIAGNOSIS — I10 Essential (primary) hypertension: Secondary | ICD-10-CM | POA: Insufficient documentation

## 2014-09-05 HISTORY — DX: Obesity, unspecified: E66.9

## 2014-09-05 HISTORY — DX: Pure hypercholesterolemia, unspecified: E78.00

## 2014-09-05 HISTORY — DX: Essential (primary) hypertension: I10

## 2014-09-05 NOTE — Progress Notes (Signed)
Patient ID: Meghan Santos, female   DOB: 16-Jul-1935, 79 y.o.   MRN: [id number]     Reason for office visit Dyspnea  Mrs. Niehaus has no known cardiac illness, but has numerous risk factors including type 2 DM, hyperlipidemia and obesity. She has always been sedentary, but over the last year has noticed worsened dyspnea NYHA class II - one flight of stairs).  In 1998 she had left breast cancer and had surgery, radiation therapy and chemotherapy with the "red devil" (adriamycin). She does not recall having echo or other cardiac function monitoring during or after chemo and states that she asked to have "double dose" chemo to shorten the duration of therapy.  She denies exertional chest pain. She has had swelling in her legs "forever". She has occasional meal related heartburn.  No Known Allergies  Current Outpatient Prescriptions  Medication Sig Dispense Refill  . aspirin 81 MG tablet Take 81 mg by mouth.    . benazepril-hydrochlorthiazide (LOTENSIN HCT) 10-12.5 MG per tablet daily.     . Biotin 5000 MCG TABS Take by mouth every morning.    . Cholecalciferol (VITAMIN D) 2000 UNITS tablet Take 2,000 Units by mouth daily.    . Coenzyme Q10 (CO Q-10) 100 MG CAPS Take by mouth every morning.    Marland Kitchen CRESTOR 20 MG tablet 20 mg daily.     . Cyanocobalamin (VITAMIN B 12 PO) Take by mouth every morning.    . diltiazem (TIAZAC) 240 MG 24 hr capsule daily.     Marland Kitchen levothyroxine (SYNTHROID, LEVOTHROID) 125 MCG tablet 125 mcg daily.     Marland Kitchen linagliptin (TRADJENTA) 5 MG TABS tablet Take 5 mg by mouth daily.    Marland Kitchen LORazepam (ATIVAN) 1 MG tablet at bedtime.     . metformin (FORTAMET) 1000 MG (OSM) 24 hr tablet Take 1,000 mg by mouth daily with breakfast.     . Multiple Vitamin (MULTI-VITAMIN DAILY PO) Take by mouth every morning.    . venlafaxine XR (EFFEXOR-XR) 150 MG 24 hr capsule Take 150 mg by mouth daily with breakfast.     No current facility-administered medications for this visit.    Past Medical  History  Diagnosis Date  . Breast cancer 1998    left breast with radiation and chemo  . Hypertension   . Hypercholesterolemia   . Diabetes   . Essential hypertension 09/05/2014  . Hypercholesteremia 09/05/2014  . Mild obesity 09/05/2014    Past Surgical History  Procedure Laterality Date  . Breast lumpectomy  1998    left breast  . Vaginal hysterectomy  1983  . Tonsillectomy  1958    Family History  Problem Relation Age of Onset  . Heart attack Mother   . Diabetes Brother   . Cancer Sister     colon  . Dementia Father   . Alcoholism Brother     History   Social History  . Marital Status: Married    Spouse Name: N/A    Number of Children: N/A  . Years of Education: N/A   Occupational History  . Not on file.   Social History Main Topics  . Smoking status: Never Smoker   . Smokeless tobacco: Never Used     Comment: never used tobacco  . Alcohol Use: 0.0 oz/week    0 Not specified per week     Comment: occas.  . Drug Use: No  . Sexual Activity: Not on file   Other Topics Concern  . Not on file  Social History Narrative    Review of systems: The patient specifically denies any chest pain with exertion, dyspnea at rest, orthopnea, paroxysmal nocturnal dyspnea, syncope, palpitations, focal neurological deficits, intermittent claudication, lower extremity edema, unexplained weight gain, cough, hemoptysis or wheezing.  The patient also denies abdominal pain, nausea, vomiting, dysphagia, diarrhea, constipation, polyuria, polydipsia, dysuria, hematuria, frequency, urgency, abnormal bleeding or bruising, fever, chills, unexpected weight changes, mood swings, change in skin or hair texture, change in voice quality, auditory or visual problems, allergic reactions or rashes, new musculoskeletal complaints other than usual "aches and pains".   PHYSICAL EXAM BP 112/72 mmHg  Pulse 75  Resp 18  Ht 5\' 5"  (1.651 m)  Wt 192 lb 3.2 oz (87.181 kg)  BMI 31.98  kg/m2  General: Alert, oriented x3, no distress Head: no evidence of trauma, PERRL, EOMI, no exophtalmos or lid lag, no myxedema, no xanthelasma; normal ears, nose and oropharynx Neck: normal jugular venous pulsations and no hepatojugular reflux; brisk carotid pulses without delay and no carotid bruits Chest: clear to auscultation, no signs of consolidation by percussion or palpation, normal fremitus, symmetrical and full respiratory excursions Cardiovascular: normal position and quality of the apical impulse, regular rhythm, normal first and second heart sounds, no murmurs, rubs or gallops Abdomen: no tenderness or distention, no masses by palpation, no abnormal pulsatility or arterial bruits, normal bowel sounds, no hepatosplenomegaly Extremities: no clubbing, cyanosis or edema; 2+ radial, ulnar and brachial pulses bilaterally; 2+ right femoral, posterior tibial and dorsalis pedis pulses; 2+ left femoral, posterior tibial and dorsalis pedis pulses; no subclavian or femoral bruits Neurological: grossly nonfocal   EKG: NSR, first degree AV block, a single PVC. Otherwise normal  Lipid Panel  No results found for: CHOL, TRIG, HDL, CHOLHDL, VLDL, LDLCALC, LDLDIRECT  BMET    Component Value Date/Time   NA 136 05/14/2014 1428   NA 137 05/12/2013 1332   K 4.1 05/14/2014 1428   K 4.2 05/12/2013 1332   CL 100 05/14/2014 1428   CL 97* 05/12/2013 1332   CO2 25 05/14/2014 1428   CO2 28 05/12/2013 1332   GLUCOSE 154* 05/14/2014 1428   GLUCOSE 104 05/12/2013 1332   BUN 10 05/14/2014 1428   BUN 10 05/12/2013 1332   CREATININE 0.78 05/14/2014 1428   CREATININE 0.9 05/12/2013 1332   CALCIUM 9.5 05/14/2014 1428   CALCIUM 9.4 05/12/2013 1332     ASSESSMENT AND PLAN  Mrs. Carda has recent onset and progressive exertional dyspnea and has numerous conventional risk factors for CAD and CHF as well as previous chest radiation and (potentially) cardiotoxic chemotherapy. She is at increased risk for  CAD, dilated CMP, restrictive CMP and constrictive pericarditis.  Recommend a myocardial perfusion study and echo, follow up to discuss those results in office. She is already on ACEi, ASA and statin. If LVEF is abnormal, will recommend coronary angiography even in the absence of clear perfusion abnormalities (risk of left main stenosis with chest XRT), will stop diltiazem and gradually introduce a beta blocker.  Orders Placed This Encounter  Procedures  . Myocardial Perfusion Imaging  . EKG 12-Lead  . 2D Echocardiogram without contrast   Meds ordered this encounter  Medications  . linagliptin (TRADJENTA) 5 MG TABS tablet    Sig: Take 5 mg by mouth daily.    Holli Humbles, MD, [place] [phone] office [phone] pager

## 2014-09-15 ENCOUNTER — Telehealth (HOSPITAL_COMMUNITY): Payer: Self-pay

## 2014-09-15 NOTE — Telephone Encounter (Signed)
Encounter complete. 

## 2014-09-16 ENCOUNTER — Telehealth (HOSPITAL_COMMUNITY): Payer: Self-pay

## 2014-09-16 NOTE — Telephone Encounter (Signed)
Encounter complete. 

## 2014-09-17 ENCOUNTER — Ambulatory Visit (HOSPITAL_BASED_OUTPATIENT_CLINIC_OR_DEPARTMENT_OTHER)
Admission: RE | Admit: 2014-09-17 | Discharge: 2014-09-17 | Disposition: A | Discharge status: Home / Self Care | Payer: Medicare Other | Source: Ambulatory Visit | Attending: Cardiovascular Disease | Admitting: Cardiovascular Disease

## 2014-09-17 ENCOUNTER — Ambulatory Visit (HOSPITAL_COMMUNITY)
Admission: RE | Admit: 2014-09-17 | Discharge: 2014-09-17 | Disposition: A | Discharge status: Home / Self Care | Payer: Medicare Other | Source: Ambulatory Visit | Attending: Cardiovascular Disease | Admitting: Cardiovascular Disease

## 2014-09-17 DIAGNOSIS — R0600 Dyspnea, unspecified: Secondary | ICD-10-CM | POA: Diagnosis present

## 2014-09-17 DIAGNOSIS — Z8249 Family history of ischemic heart disease and other diseases of the circulatory system: Secondary | ICD-10-CM | POA: Diagnosis not present

## 2014-09-17 DIAGNOSIS — Z6831 Body mass index (BMI) 31.0-31.9, adult: Secondary | ICD-10-CM | POA: Diagnosis not present

## 2014-09-17 DIAGNOSIS — E669 Obesity, unspecified: Secondary | ICD-10-CM | POA: Diagnosis not present

## 2014-09-17 DIAGNOSIS — E119 Type 2 diabetes mellitus without complications: Secondary | ICD-10-CM | POA: Insufficient documentation

## 2014-09-17 DIAGNOSIS — I359 Nonrheumatic aortic valve disorder, unspecified: Secondary | ICD-10-CM

## 2014-09-17 DIAGNOSIS — I10 Essential (primary) hypertension: Secondary | ICD-10-CM | POA: Diagnosis not present

## 2014-09-17 DIAGNOSIS — R079 Chest pain, unspecified: Secondary | ICD-10-CM | POA: Diagnosis not present

## 2014-09-17 MED ORDER — TECHNETIUM TC 99M SESTAMIBI GENERIC - CARDIOLITE
10.5000 | Freq: Once | INTRAVENOUS | Status: AC | PRN
Start: 2014-09-17 — End: 2014-09-17
  Administered 2014-09-17: 11 via INTRAVENOUS

## 2014-09-17 MED ORDER — TECHNETIUM TC 99M SESTAMIBI GENERIC - CARDIOLITE
32.3000 | Freq: Once | INTRAVENOUS | Status: AC | PRN
  Administered 2014-09-17: 32.3 via INTRAVENOUS

## 2014-09-17 NOTE — Procedures (Addendum)
[address] [place] [address] [phone]  Cardiology Nuclear Med Study  Meghan Santos is a 79 y.o. female     MRN : [id number]     DOB: [birth year]  Procedure Date: 09/17/2014  Nuclear Med Background Indication for Stress Test:  Evaluation for Ischemia History:  No prior cardiac or respiratopry history reported;No prior NUC MPI for comparison; Cardiac Risk Factors: Family History - CAD, Hypertension, Lipids, NIDDM and Obesity  Symptoms:  Chest Pain, DOE and Fatigue   Nuclear Pre-Procedure Caffeine/Decaff Intake:  9:00pm NPO After: 7:00am   IV Site: R Forearm  IV 0.9% NS with Angio Cath:  22g  Chest Size (in):  n/a IV Started by: Rolene Course, RN  Height: 5\' 5"  (1.651 m)  Cup Size: B;Pt is s/p left lumpectomy and lymphectomy.  BMI:  Body mass index is 31.95 kg/(m^2). Weight:  192 lb (87.091 kg)   Tech Comments:  No B/P or IV on Left arm.    Nuclear Med Study 1 or 2 day study: 1 day  Stress Test Type:  [place]  Order Authorizing Provider:  Sanda Klein, MD   Resting Radionuclide: Technetium 54m Sestamibi  Resting Radionuclide Dose: 10.5 mCi   Stress Radionuclide:  Technetium 24m Sestamibi  Stress Radionuclide Dose: 32.3 mCi           Stress Protocol Rest HR: 77 Stress HR: 127  Rest BP: 135/82 Stress BP: 167/84  Exercise Time (min): 3:15 METS: 4.9   Predicted Max HR: 143 bpm % Max HR: 88.81 bpm Rate Pressure Product: 21209  Dose of Adenosine (mg):  n/a Dose of Lexiscan: n/a mg  Dose of Atropine (mg): n/a Dose of Dobutamine: n/a mcg/kg/min (at max HR)  Stress Test Technologist: Leane Para, CCT Nuclear Technologist: Imagene Riches, CNMT   Rest Procedure:  Myocardial perfusion imaging was performed at rest 45 minutes following the intravenous administration of Technetium 26m Sestamibi. Stress Procedure:  The patient performed treadmill exercise using a Bruce  Protocol for 3:15 minutes. The  patient stopped due to Marked SOB and denied any chest pain.  There were NSST-T wave changes.  Technetium 63m Sestamibi was injected IV at peak exercise and myocardial perfusion imaging was performed after a brief delay.  Transient Ischemic Dilatation (Normal <1.22):  0.66  QGS EDV:  93 ml QGS ESV:  35 ml LV Ejection Fraction: 63%    Rest ECG: NSR with T wave inversion in aVL  Stress ECG: No significant ST segment change suggestive of ischemia. and occasional PVC in recovery  QPS Raw Data Images:  Normal; no motion artifact; normal heart/lung ratio. Stress Images:  Small in size moderate in intensity apical defect; otherwise normal myocardial perfusion in other territories. Rest Images:  Normal homogeneous uptake in all areas of the myocardium. Subtraction (SDS):  These findings are consistent with ischemia.  Impression Exercise Capacity:  Poor exercise capacity. BP Response:  Normal blood pressure response. Clinical Symptoms:  Moderate shortness of breath ECG Impression:  No significant ST segment change suggestive of ischemia. Comparison with Prior Nuclear Study: No previous nuclear study performed  Overall Impression:  Intermediate risk stress nuclear study demostrating a small region of moderate apical ischemia.  LV Wall Motion:  NL LV Function, EF 63%; with a very small region of apical lateral hypocontractility.   Troy Sine, MD  09/17/2014 6:06 PM

## 2014-09-17 NOTE — Progress Notes (Signed)
2D Echocardiogram Complete.  09/17/2014   Meghan Santos [place], [place]

## 2014-09-22 ENCOUNTER — Telehealth: Payer: Self-pay | Admitting: *Deleted

## 2014-09-22 ENCOUNTER — Telehealth: Payer: Self-pay | Admitting: Cardiovascular Disease

## 2014-09-22 MED ORDER — FUROSEMIDE 20 MG PO TABS: 20.0000 mg | ORAL_TABLET | Freq: Every day | ORAL | Status: DC

## 2014-09-22 NOTE — Telephone Encounter (Signed)
Verified pharmacy received electronically, called pt to notify.

## 2014-09-22 NOTE — Telephone Encounter (Signed)
Pt called in stating that she spoke with [place] today about her furosemide prescription and she says that the pharmacy still has not received it. Please call  Thanks

## 2014-09-22 NOTE — Telephone Encounter (Signed)
-----   Message from Sanda Klein, MD sent at 09/18/2014  9:21 AM EST ----- Very samll area of abnormality on nuclear study. Cause of dyspnea is unclear. Suggestion of possible diastolic dysfunction/fluid overload on echo. Try furosemide 20 mg daily until follow up appointment.

## 2014-09-22 NOTE — Telephone Encounter (Signed)
NUC AND ECHO RESULTS CALLED TO PATIENT.  WILL START FUROSEMIDE 20MG  QD TODAY AND KEEP APPT 10/21/14.  PATIENT VOICED UNDERSTANDING.

## 2014-10-13 ENCOUNTER — Ambulatory Visit: Payer: Medicare Other | Admitting: Internal Medicine

## 2014-10-21 ENCOUNTER — Encounter: Payer: Self-pay | Admitting: Cardiovascular Disease

## 2014-10-21 ENCOUNTER — Ambulatory Visit (INDEPENDENT_AMBULATORY_CARE_PROVIDER_SITE_OTHER): Payer: Medicare Other | Admitting: Cardiovascular Disease

## 2014-10-21 VITALS — BP 124/72 | HR 70 | Resp 16 | Ht 66.0 in | Wt 191.0 lb

## 2014-10-21 DIAGNOSIS — E7800 Pure hypercholesterolemia, unspecified: Secondary | ICD-10-CM

## 2014-10-21 DIAGNOSIS — E669 Obesity, unspecified: Secondary | ICD-10-CM

## 2014-10-21 DIAGNOSIS — R0600 Dyspnea, unspecified: Secondary | ICD-10-CM

## 2014-10-21 DIAGNOSIS — R0609 Other forms of dyspnea: Secondary | ICD-10-CM

## 2014-10-21 DIAGNOSIS — I10 Essential (primary) hypertension: Secondary | ICD-10-CM

## 2014-10-21 MED ORDER — FUROSEMIDE 40 MG PO TABS: 40.0000 mg | ORAL_TABLET | Freq: Every day | ORAL | Status: DC

## 2014-10-21 NOTE — Progress Notes (Signed)
Patient ID: Meghan Santos, female   DOB: [birth year], 80 y.o.   MRN: [id number]     Reason for office visit Exertional dyspnea, diastolic dysfunction by echo, mild aortic stenosis  Meghan Santos returns in follow-up after undergoing a nuclear stress test and echocardiogram. The nuclear perfusion study was almost normal with the exception of a small region of moderate apical ischemia. The left ventricular ejection fraction was calculated at 63%. The echocardiogram also confirmed normal left ventricular systolic function and showed only mild aortic stenosis. The Doppler parameters suggested probable diastolic dysfunction with elevated filling pressures.however there was no evidence of either left ventricular hypertrophy or left atrial enlargement to corroborate the Doppler findings.  Treatment with a very low dose of furosemide did not lead to significant noticeable increase in urine output, did not lead to weight reduction and has not been accompanied by any improvement in symptoms.  She has numerous risk factors including type 2 DM, hyperlipidemia and obesity. She has always been sedentary, but over the last year has noticed worsened dyspnea NYHA class II (one flight of stairs). In 1998 she had left breast cancer and had surgery, radiation therapy and chemotherapy with adriamycin.   No Known Allergies  Current Outpatient Prescriptions  Medication Sig Dispense Refill  . aspirin 81 MG tablet Take 81 mg by mouth.    . benazepril-hydrochlorthiazide (LOTENSIN HCT) 10-12.5 MG per tablet daily.     . Biotin 5000 MCG TABS Take by mouth every morning.    . Cholecalciferol (VITAMIN D) 2000 UNITS tablet Take 2,000 Units by mouth daily.    . Coenzyme Q10 (CO Q-10) 100 MG CAPS Take by mouth every morning.    Marland Kitchen CRESTOR 20 MG tablet 20 mg daily.     . Cyanocobalamin (VITAMIN B 12 PO) Take by mouth every morning.    . diltiazem (TIAZAC) 240 MG 24 hr capsule daily.     Marland Kitchen levothyroxine (SYNTHROID, LEVOTHROID) 125  MCG tablet 125 mcg daily.     Marland Kitchen LORazepam (ATIVAN) 1 MG tablet at bedtime.     . metformin (FORTAMET) 1000 MG (OSM) 24 hr tablet Take 1,000 mg by mouth daily with breakfast.     . Multiple Vitamin (MULTI-VITAMIN DAILY PO) Take by mouth every morning.    . venlafaxine XR (EFFEXOR-XR) 150 MG 24 hr capsule Take 150 mg by mouth daily with breakfast.    . furosemide (LASIX) 40 MG tablet Take 1 tablet (40 mg total) by mouth daily. 30 tablet 9   No current facility-administered medications for this visit.    Past Medical History  Diagnosis Date  . Breast cancer 1998    left breast with radiation and chemo  . Hypertension   . Hypercholesterolemia   . Diabetes   . Essential hypertension 09/05/2014  . Hypercholesteremia 09/05/2014  . Mild obesity 09/05/2014    Past Surgical History  Procedure Laterality Date  . Breast lumpectomy  1998    left breast  . Vaginal hysterectomy  1983  . Tonsillectomy  1958    Family History  Problem Relation Age of Onset  . Heart attack Mother   . Diabetes Brother   . Cancer Sister     colon  . Dementia Father   . Alcoholism Brother     History   Social History  . Marital Status: Married    Spouse Name: N/A    Number of Children: N/A  . Years of Education: N/A   Occupational History  . Not on file.  Social History Main Topics  . Smoking status: Never Smoker   . Smokeless tobacco: Never Used     Comment: never used tobacco  . Alcohol Use: 0.0 oz/week    0 Not specified per week     Comment: occas.  . Drug Use: No  . Sexual Activity: Not on file   Other Topics Concern  . Not on file   Social History Narrative    Review of systems: The patient specifically denies any chest pain with exertion, dyspnea at rest, orthopnea, paroxysmal nocturnal dyspnea, syncope, palpitations, focal neurological deficits, intermittent claudication, lower extremity edema, unexplained weight gain, cough, hemoptysis or wheezing.  The patient also denies  abdominal pain, nausea, vomiting, dysphagia, diarrhea, constipation, polyuria, polydipsia, dysuria, hematuria, frequency, urgency, abnormal bleeding or bruising, fever, chills, unexpected weight changes, mood swings, change in skin or hair texture, change in voice quality, auditory or visual problems, allergic reactions or rashes, new musculoskeletal complaints other than usual "aches and pains".  PHYSICAL EXAM BP 124/72 mmHg  Pulse 70  Resp 16  Ht 5\' 6"  (1.676 m)  Wt 191 lb (86.637 kg)  BMI 30.84 kg/m2 General: Alert, oriented x3, no distress Head: no evidence of trauma, PERRL, EOMI, no exophtalmos or lid lag, no myxedema, no xanthelasma; normal ears, nose and oropharynx Neck: normal jugular venous pulsations and no hepatojugular reflux; brisk carotid pulses without delay and no carotid bruits Chest: clear to auscultation, no signs of consolidation by percussion or palpation, normal fremitus, symmetrical and full respiratory excursions Cardiovascular: normal position and quality of the apical impulse, regular rhythm, normal first and second heart sounds, no murmurs, rubs or gallops Abdomen: no tenderness or distention, no masses by palpation, no abnormal pulsatility or arterial bruits, normal bowel sounds, no hepatosplenomegaly Extremities: no clubbing, cyanosis or edema; 2+ radial, ulnar and brachial pulses bilaterally; 2+ right femoral, posterior tibial and dorsalis pedis pulses; 2+ left femoral, posterior tibial and dorsalis pedis pulses; no subclavian or femoral bruits Neurological: grossly nonfocal    Lipid Panel  No results found for: CHOL, TRIG, HDL, CHOLHDL, VLDL, LDLCALC, LDLDIRECT  BMET    Component Value Date/Time   NA 136 05/14/2014 1428   NA 137 05/12/2013 1332   K 4.1 05/14/2014 1428   K 4.2 05/12/2013 1332   CL 100 05/14/2014 1428   CL 97* 05/12/2013 1332   CO2 25 05/14/2014 1428   CO2 28 05/12/2013 1332   GLUCOSE 154* 05/14/2014 1428   GLUCOSE 104 05/12/2013 1332     BUN 10 05/14/2014 1428   BUN 10 05/12/2013 1332   CREATININE 0.78 05/14/2014 1428   CREATININE 0.9 05/12/2013 1332   CALCIUM 9.5 05/14/2014 1428   CALCIUM 9.4 05/12/2013 1332     ASSESSMENT AND PLAN  There is no evidence of anthracycline-induced cardiomyopathy. There is very subtle evidence to suggest coronary insufficiency by nuclear stress testing. A very low dose of loop diuretic has not lead to improvement in dyspnea, which presumably is related to diastolic dysfunction.  We'll increase the dose of furosemide to 40 mg daily. Follow-up electrolytes and BNP level. If we cannot alleviate her symptoms of medical therapy, may have to consider right and left heart catheterization to make sure she does not have extensive "balanced" ischemia and to directly measure intracardiac pressures and confirm the presumptive diagnosis of diastolic heart failure  Orders Placed This Encounter  Procedures  . Basic Metabolic Panel (BMET)  . B Nat Peptide   Meds ordered this encounter  Medications  .  furosemide (LASIX) 40 MG tablet    Sig: Take 1 tablet (40 mg total) by mouth daily.    Dispense:  30 tablet    Refill:  [phone], MD, [facility] [phone] office [phone] pager

## 2014-10-21 NOTE — Patient Instructions (Signed)
Your physician recommends that you schedule a follow-up appointment in: 6-8 weeks  Your physician recommends that you return for lab work in: 2 Weeks BMP, BNP  Your physician has recommended you make the following change in your medication: Increase Furosemide to 40 mg daily.

## 2014-11-10 LAB — BASIC METABOLIC PANEL
BUN: 14 mg/dL (ref 6–23)
CHLORIDE: 99 meq/L (ref 96–112)
CO2: 28 mEq/L (ref 19–32)
Calcium: 9.7 mg/dL (ref 8.4–10.5)
Creat: 0.71 mg/dL (ref 0.50–1.10)
Glucose, Bld: 154 mg/dL — ABNORMAL HIGH (ref 70–99)
POTASSIUM: 4.5 meq/L (ref 3.5–5.3)
SODIUM: 138 meq/L (ref 135–145)

## 2014-11-10 LAB — BRAIN NATRIURETIC PEPTIDE: Brain Natriuretic Peptide: 16.8 pg/mL (ref 0.0–100.0)

## 2014-11-11 ENCOUNTER — Telehealth: Payer: Self-pay | Admitting: *Deleted

## 2014-11-11 NOTE — Telephone Encounter (Signed)
Spoke to patient. Result given . Verbalized understanding  

## 2014-11-11 NOTE — Telephone Encounter (Signed)
-----   Message from Sanda Klein, MD sent at 11/10/2014  3:25 PM EST ----- Current labs make CHF a very unlikely cause for dyspnea (at least at this point in time)

## 2014-11-23 ENCOUNTER — Ambulatory Visit: Payer: Medicare Other | Admitting: Cardiovascular Disease

## 2014-12-22 ENCOUNTER — Encounter: Payer: Self-pay | Admitting: Cardiovascular Disease

## 2014-12-22 ENCOUNTER — Ambulatory Visit (INDEPENDENT_AMBULATORY_CARE_PROVIDER_SITE_OTHER): Payer: Medicare Other | Admitting: Cardiovascular Disease

## 2014-12-22 VITALS — BP 116/78 | HR 70 | Resp 20 | Ht 66.5 in | Wt 189.0 lb

## 2014-12-22 DIAGNOSIS — E7800 Pure hypercholesterolemia, unspecified: Secondary | ICD-10-CM

## 2014-12-22 DIAGNOSIS — Z79899 Other long term (current) drug therapy: Secondary | ICD-10-CM | POA: Diagnosis not present

## 2014-12-22 DIAGNOSIS — I10 Essential (primary) hypertension: Secondary | ICD-10-CM

## 2014-12-22 DIAGNOSIS — R931 Abnormal findings on diagnostic imaging of heart and coronary circulation: Secondary | ICD-10-CM | POA: Insufficient documentation

## 2014-12-22 DIAGNOSIS — R0609 Other forms of dyspnea: Secondary | ICD-10-CM

## 2014-12-22 DIAGNOSIS — R0600 Dyspnea, unspecified: Secondary | ICD-10-CM

## 2014-12-22 MED ORDER — POTASSIUM CHLORIDE CRYS ER 20 MEQ PO TBCR: 20.0000 meq | EXTENDED_RELEASE_TABLET | Freq: Every day | ORAL | Status: DC

## 2014-12-22 MED ORDER — FUROSEMIDE 40 MG PO TABS: 80.0000 mg | ORAL_TABLET | Freq: Every day | ORAL | Status: DC

## 2014-12-22 NOTE — Patient Instructions (Addendum)
Your physician has recommended you make the following change in your medication: increase the furosemide to 80 mg daily. Start new prescription for potassium. These prescriptions has already been sent to your pharmacy.   Your physician recommends that you return for lab work in: 2-3 weeks.  Your physician recommends that you schedule a follow-up appointment in: 2 months.

## 2014-12-22 NOTE — Progress Notes (Signed)
Patient ID: Meghan Santos, female   DOB: 1935-04-08, 80 y.o.   MRN: [id number]     Cardiology Office Note   Date:  12/23/2014   ID:  Meghan Santos, DOB [birth year], MRN [id number]  PCP:  Jerlyn Ly, MD  Cardiologist:   Sanda Klein, MD   Chief Complaint  Patient presents with  . Follow-up    6 Weeks follow-up, pt denied chest pain and SOB      History of Present Illness: Meghan Santos is a 80 y.o. female who presents for follow up of complaints of exertional dyspnea and leg edema. Her echo showed normal LVEF, but abnormal diastolic function (despite absence of LVH) and mild aortic stenosis. Her nuclear study showed only a small area of apical ischemia. I have offered invasive evaluation, but she prefers conservative management. After an increase in her diuretic dose she has had some noticeable increase in urine output, a 3 lb weight loss and (maybe) mild improvement in dyspnea.  She has numerous risk factors including type 2 DM, hyperlipidemia and obesity. She has always been sedentary, but over the last year has noticed worsened dyspnea NYHA class II (one flight of stairs). In 1998 she had left breast cancer and had surgery, radiation therapy and chemotherapy with adriamycin.   Past Medical History  Diagnosis Date  . Breast cancer 1998    left breast with radiation and chemo  . Hypertension   . Hypercholesterolemia   . Diabetes   . Essential hypertension 09/05/2014  . Hypercholesteremia 09/05/2014  . Mild obesity 09/05/2014    Past Surgical History  Procedure Laterality Date  . Breast lumpectomy  1998    left breast  . Vaginal hysterectomy  1983  . Tonsillectomy  1958     Current Outpatient Prescriptions  Medication Sig Dispense Refill  . aspirin 81 MG tablet Take 81 mg by mouth.    . benazepril-hydrochlorthiazide (LOTENSIN HCT) 10-12.5 MG per tablet daily.     . Biotin 5000 MCG TABS Take by mouth every morning.    . Cholecalciferol (VITAMIN D) 2000 UNITS tablet Take  2,000 Units by mouth daily.    . Coenzyme Q10 (CO Q-10) 100 MG CAPS Take 200 mg by mouth every morning.     Marland Kitchen CRESTOR 20 MG tablet 20 mg daily.     . Cyanocobalamin (VITAMIN B 12 PO) Take by mouth every morning.    . diltiazem (TIAZAC) 240 MG 24 hr capsule daily.     . furosemide (LASIX) 40 MG tablet Take 2 tablets (80 mg total) by mouth daily. 60 tablet 9  . levothyroxine (SYNTHROID, LEVOTHROID) 125 MCG tablet 125 mcg daily.     Marland Kitchen LORazepam (ATIVAN) 1 MG tablet at bedtime.     . metformin (FORTAMET) 1000 MG (OSM) 24 hr tablet Take 1,000 mg by mouth daily with breakfast.     . Multiple Vitamin (MULTI-VITAMIN DAILY PO) Take by mouth every morning.    . venlafaxine XR (EFFEXOR-XR) 150 MG 24 hr capsule Take 150 mg by mouth daily with breakfast.    . potassium chloride SA (K-DUR,KLOR-CON) 20 MEQ tablet Take 1 tablet (20 mEq total) by mouth daily. 30 tablet 6   No current facility-administered medications for this visit.    Allergies:   Review of patient's allergies indicates no known allergies.    Social History:  The patient  reports that she has never smoked. She has never used smokeless tobacco. She reports that she drinks alcohol. She reports that she  does not use illicit drugs.   Family History:  The patient's family history includes Alcoholism in her brother; Cancer in her sister; Dementia in her father; Diabetes in her brother; Heart attack in her mother.    ROS:  Please see the history of present illness.    Otherwise, review of systems positive for none.   All other systems are reviewed and negative.    PHYSICAL EXAM: VS:  BP 116/78 mmHg  Pulse 70  Resp 20  Ht 5' 6.5" (1.689 m)  Wt 189 lb (85.73 kg)  BMI 30.05 kg/m2 , BMI Body mass index is 30.05 kg/(m^2).  General: Alert, oriented x3, no distress Head: no evidence of trauma, PERRL, EOMI, no exophtalmos or lid lag, no myxedema, no xanthelasma; normal ears, nose and oropharynx Neck: normal jugular venous pulsations and no  hepatojugular reflux; brisk carotid pulses without delay and no carotid bruits Chest: clear to auscultation, no signs of consolidation by percussion or palpation, normal fremitus, symmetrical and full respiratory excursions Cardiovascular: normal position and quality of the apical impulse, regular rhythm, normal first and second heart sounds, no murmurs, rubs or gallops Abdomen: no tenderness or distention, no masses by palpation, no abnormal pulsatility or arterial bruits, normal bowel sounds, no hepatosplenomegaly Extremities: no clubbing, cyanosis or edema; 2+ radial, ulnar and brachial pulses bilaterally; 2+ right femoral, posterior tibial and dorsalis pedis pulses; 2+ left femoral, posterior tibial and dorsalis pedis pulses; no subclavian or femoral bruits Neurological: grossly nonfocal Psych: euthymic mood, full affect   EKG:  EKG is not ordered today.    Recent Labs: 05/14/2014: ALT 46*; Hemoglobin 11.8; Platelets 265 11/09/2014: BUN 14; Creatinine 0.71; Potassium 4.5; Sodium 138     Wt Readings from Last 3 Encounters:  12/22/14 189 lb (85.73 kg)  10/21/14 191 lb (86.637 kg)  09/17/14 192 lb (87.091 kg)      ASSESSMENT AND PLAN:  I think right and left catheterization would clarify whether or not she has significant CAD (maybe more than suggested by the perfusion study) and confirm the suspected diagnosis of diastolic heart failure. Previous chest radiation raises the possibility of myocardial fibrosis and pericardial constriction, also raises concern for proximal coronary artery disease (stenosis of dominant left main coronary?). Discussed the benefits of more diagnostic data and the relatively low risk of cardiac cath complications.  She continues to prefer a conservative approach. Will increase the dose of furosemide and add KCl, then reevaluate.   Current medicines are reviewed at length with the patient today.  The patient does not have concerns regarding medicines.  The  following changes have been made:  Increase furosemide to 80 mg daily and add K supplement  Labs/ tests ordered today include:  Orders Placed This Encounter  Procedures  . Basic metabolic panel   Patient Instructions  Your physician has recommended you make the following change in your medication: increase the furosemide to 80 mg daily. Start new prescription for potassium. These prescriptions has already been sent to your pharmacy.   Your physician recommends that you return for lab work in: 2-3 weeks.  Your physician recommends that you schedule a follow-up appointment in: 2 months.    Mikael Spray, MD  12/23/2014 9:24 PM    Sanda Klein, MD, [facility] [phone] office [phone] pager

## 2014-12-23 ENCOUNTER — Telehealth: Payer: Self-pay | Admitting: Cardiovascular Disease

## 2014-12-23 NOTE — Telephone Encounter (Signed)
Pt wants her Pharmacy changed to [facility] on [address] please.

## 2014-12-23 NOTE — Telephone Encounter (Signed)
RN INFORMED PATIENT PHARMACY WAS CHANGED TO [facility] ([phone]) SHE STATES SHE DOES NOT NEED ANY MEDICATION AT PRESENT TIME

## 2014-12-24 ENCOUNTER — Telehealth: Payer: Self-pay | Admitting: Cardiovascular Disease

## 2014-12-30 NOTE — Telephone Encounter (Signed)
Closed encounter °

## 2015-01-11 ENCOUNTER — Telehealth: Payer: Self-pay | Admitting: *Deleted

## 2015-01-11 DIAGNOSIS — E7800 Pure hypercholesterolemia, unspecified: Secondary | ICD-10-CM

## 2015-01-11 NOTE — Telephone Encounter (Signed)
Spoke with pt, she states dr perini usually checks her liver but did not this time. She wanted it added to the bmp she had done today. Lab order placed.

## 2015-01-11 NOTE — Telephone Encounter (Signed)
Patient filled out walk-in sheet requesting a liver function test be set to the lab for her. Left message for pt to call

## 2015-01-12 LAB — HEPATIC FUNCTION PANEL
ALBUMIN: 4.2 g/dL (ref 3.5–5.2)
ALK PHOS: 60 U/L (ref 39–117)
ALT: 29 U/L (ref 0–35)
AST: 33 U/L (ref 0–37)
BILIRUBIN INDIRECT: 0.3 mg/dL (ref 0.2–1.2)
BILIRUBIN TOTAL: 0.4 mg/dL (ref 0.2–1.2)
Bilirubin, Direct: 0.1 mg/dL (ref 0.0–0.3)
Total Protein: 7.1 g/dL (ref 6.0–8.3)

## 2015-01-12 LAB — BASIC METABOLIC PANEL
BUN: 15 mg/dL (ref 6–23)
CHLORIDE: 98 meq/L (ref 96–112)
CO2: 22 mEq/L (ref 19–32)
CREATININE: 0.86 mg/dL (ref 0.50–1.10)
Calcium: 9.8 mg/dL (ref 8.4–10.5)
Glucose, Bld: 238 mg/dL — ABNORMAL HIGH (ref 70–99)
POTASSIUM: 4.4 meq/L (ref 3.5–5.3)
SODIUM: 136 meq/L (ref 135–145)

## 2015-03-01 ENCOUNTER — Encounter: Payer: Self-pay | Admitting: Podiatry

## 2015-03-01 ENCOUNTER — Telehealth: Payer: Self-pay | Admitting: *Deleted

## 2015-03-01 ENCOUNTER — Ambulatory Visit: Payer: Medicare Other

## 2015-03-01 ENCOUNTER — Ambulatory Visit (INDEPENDENT_AMBULATORY_CARE_PROVIDER_SITE_OTHER): Payer: Medicare Other | Admitting: Podiatry

## 2015-03-01 VITALS — BP 124/86 | HR 69 | Resp 12

## 2015-03-01 DIAGNOSIS — L03032 Cellulitis of left toe: Secondary | ICD-10-CM | POA: Diagnosis not present

## 2015-03-01 DIAGNOSIS — R52 Pain, unspecified: Secondary | ICD-10-CM | POA: Diagnosis not present

## 2015-03-01 DIAGNOSIS — L02612 Cutaneous abscess of left foot: Secondary | ICD-10-CM

## 2015-03-01 NOTE — Telephone Encounter (Signed)
"  This is Meghan Santos, [phone], 06/12/1935, former patient of Dr. Paulla Dolly."  I called pt for further explanation of her message.  Pt states she's diabetic and dropped a heavy cement flower pot on her left big toe and it has a huge hematoma on it.  Pt asked if she should open it, I told her no, but she should use Epsom salt soaks until we could see her.  Pt agreed and I transferred to the schedulers.

## 2015-03-01 NOTE — Patient Instructions (Signed)

## 2015-03-01 NOTE — Progress Notes (Signed)
   Subjective:    Patient ID: Meghan Santos, female    DOB: 03/10/1935, 80 y.o.   MRN: [id number]  HPI  PT STATED INJURED BY DROPPING FLOWER POT ON THE TOP OF THE GREAT TOE 6 DAYS AGAIN AND STILL PAINFUL AND HAVE BLISTER. THE GREAT TOE BLISTER IS GETTING BIGGER AND THE COLOR IS GETTING WORSE. TRIED TO SOAK WITH EPSOM SALT AND WATER BUT NO HELP.  Review of Systems  Skin: Positive for color change.       Objective:   Physical Exam        Assessment & Plan:

## 2015-03-02 NOTE — Progress Notes (Signed)
Subjective:     Patient ID: Meghan Santos, female   DOB: April 16, 1935, 80 y.o.   MRN: [id number]  HPI patient presents stating she dropped a flowerpot on top of her big toe approximately 5 days ago and that she has a large blister and abscess that's been painful and she cannot wear any form shoe gear   Review of Systems  All other systems reviewed and are negative.      Objective:   Physical Exam  Constitutional: She is oriented to person, place, and time.  Cardiovascular: Intact distal pulses.   Musculoskeletal: Normal range of motion.  Neurological: She is oriented to person, place, and time.  Skin: Skin is warm.  Nursing note and vitals reviewed.  neurovascular status intact with muscle strength adequate range of motion within normal limits. Patient's noted to have good digital perfusion is well oriented and is found to have a traumatized left hallux with a large blister formation on the dorsal surface that's localized with no proximal edema erythema or drainage noted currently     Assessment:     Local abscess of the left hallux secondary to trauma with fluid accumulation    Plan:     H&P and x-ray reviewed with patient. Today under and aesthetic and sterile conditions I drained the abscess flushed the area and applied Silvadene with dressing. I explained soaks and open toed shoes and this should hopefully heal uneventfully. Reappoint if any symptoms were to occur

## 2015-03-03 ENCOUNTER — Encounter: Payer: Self-pay | Admitting: Cardiovascular Disease

## 2015-03-03 ENCOUNTER — Ambulatory Visit (INDEPENDENT_AMBULATORY_CARE_PROVIDER_SITE_OTHER): Payer: Medicare Other | Admitting: Cardiovascular Disease

## 2015-03-03 VITALS — BP 134/78 | HR 72 | Ht 67.0 in | Wt 175.0 lb

## 2015-03-03 DIAGNOSIS — R0600 Dyspnea, unspecified: Secondary | ICD-10-CM

## 2015-03-03 DIAGNOSIS — E7800 Pure hypercholesterolemia, unspecified: Secondary | ICD-10-CM

## 2015-03-03 DIAGNOSIS — I5032 Chronic diastolic (congestive) heart failure: Secondary | ICD-10-CM

## 2015-03-03 NOTE — Progress Notes (Signed)
Patient ID: Meghan Santos, female   DOB: November 01, 1934, 81 y.o.   MRN: [id number]      Cardiology Office Note   Date:  03/03/2015   ID:  Meghan Santos, DOB 16-Mar-1935, MRN [id number]  PCP:  Jerlyn Ly, MD  Cardiologist:   Sanda Klein, MD   Chief Complaint  Patient presents with  . 2 MONTH FOLLOW UP    Patient has no complaints.      History of Present Illness: Meghan Santos is a 80 y.o. female who presents for  Follow-up after initiating diuretic therapy for exertional dyspnea and leg edema. She has had a remarkable improvement. She has lost 14 more pounds since her last appointment in April and by her account roughly 20 pounds since we started treating in February. She no longer has any complaints of exertional dyspnea and feels much better. Her home scale typically shows a weight of around 169 pounds. She has already started to intermittently decrease her dose of diuretic since she felt that she was urinating excessively.   This response seems to confirm that her dyspnea was related to diastolic heart failure, although no clear etiology is identified.  Echocardiography shows normal left ventricular systolic function at around 50-55 percent, very mild aortic stenosis, without left ventricular hypertrophy. There was evidence of grade 2 diastolic dysfunction.  Nuclear stress testing showed a very small region of moderate apical ischemia. She has never had angina pectoris.  Is have numerous risk factors for heart disease including type 2 diabetes mellitus , hyperlipidemia ,  Radiation therapy to the chest and chemotherapy with Adriamycin after breast cancer in 1998.    Past Medical History  Diagnosis Date  . Breast cancer 1998    left breast with radiation and chemo  . Hypertension   . Hypercholesterolemia   . Diabetes   . Essential hypertension 09/05/2014  . Hypercholesteremia 09/05/2014  . Mild obesity 09/05/2014    Past Surgical History  Procedure Laterality Date  . Breast  lumpectomy  1998    left breast  . Vaginal hysterectomy  1983  . Tonsillectomy  1958     Current Outpatient Prescriptions  Medication Sig Dispense Refill  . aspirin 81 MG tablet Take 81 mg by mouth.    . benazepril-hydrochlorthiazide (LOTENSIN HCT) 10-12.5 MG per tablet daily.     . Biotin 5000 MCG TABS Take by mouth every morning.    . Cholecalciferol (VITAMIN D) 2000 UNITS tablet Take 2,000 Units by mouth daily.    . Coenzyme Q10 (CO Q-10) 100 MG CAPS Take 200 mg by mouth every morning.     Marland Kitchen CRESTOR 20 MG tablet 20 mg daily.     . Cyanocobalamin (VITAMIN B 12 PO) Take by mouth every morning.    . diltiazem (TIAZAC) 240 MG 24 hr capsule daily.     . furosemide (LASIX) 40 MG tablet Take 2 tablets (80 mg total) by mouth daily. 60 tablet 9  . levothyroxine (SYNTHROID, LEVOTHROID) 125 MCG tablet 125 mcg daily.     . metformin (FORTAMET) 1000 MG (OSM) 24 hr tablet Take 1,000 mg by mouth daily with breakfast.     . Multiple Vitamin (MULTI-VITAMIN DAILY PO) Take by mouth every morning.    . potassium chloride SA (K-DUR,KLOR-CON) 20 MEQ tablet Take 1 tablet (20 mEq total) by mouth daily. 30 tablet 6  . venlafaxine XR (EFFEXOR-XR) 150 MG 24 hr capsule Take 150 mg by mouth daily with breakfast.     No current  facility-administered medications for this visit.    Allergies:   Review of patient's allergies indicates no known allergies.    Social History:  The patient  reports that she has never smoked. She has never used smokeless tobacco. She reports that she drinks alcohol. She reports that she does not use illicit drugs.   Family History:  The patient's family history includes Alcoholism in her brother; Cancer in her sister; Dementia in her father; Diabetes in her brother; Heart attack in her mother.    ROS:  Please see the history of present illness.    Otherwise, review of systems positive for none.   All other systems are reviewed and negative.    PHYSICAL EXAM: VS:  BP 134/78  mmHg  Pulse 72  Ht 5\' 7"  (1.702 m)  Wt 175 lb (79.379 kg)  BMI 27.40 kg/m2 , BMI Body mass index is 27.4 kg/(m^2).  General: Alert, oriented x3, no distress Head: no evidence of trauma, PERRL, EOMI, no exophtalmos or lid lag, no myxedema, no xanthelasma; normal ears, nose and oropharynx Neck: normal jugular venous pulsations and no hepatojugular reflux; brisk carotid pulses without delay and no carotid bruits Chest: clear to auscultation, no signs of consolidation by percussion or palpation, normal fremitus, symmetrical and full respiratory excursions Cardiovascular: normal position and quality of the apical impulse, regular rhythm, normal first and second heart sounds, no murmurs, rubs or gallops Abdomen: no tenderness or distention, no masses by palpation, no abnormal pulsatility or arterial bruits, normal bowel sounds, no hepatosplenomegaly Extremities: no clubbing, cyanosis or edema; 2+ radial, ulnar and brachial pulses bilaterally; 2+ right femoral, posterior tibial and dorsalis pedis pulses; 2+ left femoral, posterior tibial and dorsalis pedis pulses; no subclavian or femoral bruits Neurological: grossly nonfocal Psych: euthymic mood, full affect   EKG:  EKG ordered today  Is normal sinus rhythm QTC 446 ms, no repolarization abnormalities   Recent Labs: 05/14/2014: HGB 11.8; Platelets 265 01/11/2015: ALT 29; BUN 15; Creat 0.86; Potassium 4.4; Sodium 136    Lipid Panel No results found for: CHOL, TRIG, HDL, CHOLHDL, VLDL, LDLCALC, LDLDIRECT    Wt Readings from Last 3 Encounters:  03/03/15 175 lb (79.379 kg)  12/22/14 189 lb (85.73 kg)  10/21/14 191 lb (86.637 kg)      ASSESSMENT AND PLAN:   Meghan Santos has had good response to diuretic therapy and seems to have diastolic heart failure. We discussed the need for continued sodium restriction, daily weight monitoring and the concept of "dry weight" and diuretic dose adjustment. I think it is reasonable to set a target weight of  around 170 pounds (on her home scale). She will take only 40 mg of furosemide if her weight is at or beneath target, but will temporarily increased to 80 mg daily when her weight exceeds 170 pounds. She is already receiving an ACE inhibitor. In the absence of left ventricular systolic dysfunction I'm not sure that we need to stop her diltiazem or start a beta blocker.  She already has lab scheduled for next week.   There are multiple possible etiologies for her diastolic heart failure,  Although none are clearly supported by the echocardiogram. She could have myocardial fibrosis and/or constrictive pericarditis following her chest radiation therapy. She may have painless ischemic heart disease with coronary stenoses again related both to radiation therapy to the chest as well as conventional coronary risk factors. She prefers noninvasive evaluation and her nuclear study was indeed low risk. She is already on a highly  active statins and takes aspirin.   Current medicines are reviewed at length with the patient today.  The patient does not have concerns regarding medicines.  The following changes have been made:    Weight-adjusted diuretic therapy  As described above.   Labs/ tests ordered today include:  Orders Placed This Encounter  Procedures  . EKG 12-Lead    Patient Instructions  Your dry weight is 170 lbs.  If your weight is less than 170 lbs take 40mg  of Furosemide daily.  If your weight is greater than 170 lbs take 80mg  Furosemide daily.  Dr. Sallyanne Kuster recommends that you schedule a follow-up appointment in: 6 months.       Mikael Spray, MD  03/03/2015 6:24 PM    Sanda Klein, MD, [facility] [phone] office [phone] pager

## 2015-03-03 NOTE — Patient Instructions (Signed)
Your dry weight is 170 lbs.  If your weight is less than 170 lbs take 40mg  of Furosemide daily.  If your weight is greater than 170 lbs take 80mg  Furosemide daily.  Dr. Sallyanne Kuster recommends that you schedule a follow-up appointment in: 6 months.

## 2015-03-05 ENCOUNTER — Ambulatory Visit: Payer: Medicare Other | Admitting: Cardiovascular Disease

## 2015-04-19 ENCOUNTER — Telehealth: Payer: Self-pay | Admitting: Hematology & Oncology

## 2015-04-19 NOTE — Telephone Encounter (Signed)
[facility] MESS REGARDING CHANGING APPT FOR 8/25 TO 8/31

## 2015-05-13 ENCOUNTER — Ambulatory Visit: Payer: Medicare Other | Admitting: Hematology & Oncology

## 2015-05-13 ENCOUNTER — Other Ambulatory Visit: Payer: Medicare Other

## 2015-05-18 ENCOUNTER — Other Ambulatory Visit: Payer: Self-pay | Admitting: *Deleted

## 2015-05-18 DIAGNOSIS — C50919 Malignant neoplasm of unspecified site of unspecified female breast: Secondary | ICD-10-CM

## 2015-05-19 ENCOUNTER — Encounter: Payer: Self-pay | Admitting: Hematology & Oncology

## 2015-05-19 ENCOUNTER — Ambulatory Visit (HOSPITAL_BASED_OUTPATIENT_CLINIC_OR_DEPARTMENT_OTHER): Payer: Medicare Other | Admitting: Hematology & Oncology

## 2015-05-19 ENCOUNTER — Other Ambulatory Visit (HOSPITAL_BASED_OUTPATIENT_CLINIC_OR_DEPARTMENT_OTHER): Payer: Medicare Other

## 2015-05-19 VITALS — BP 134/61 | HR 70 | Temp 97.9°F | Resp 16 | Ht 67.0 in | Wt 174.0 lb

## 2015-05-19 DIAGNOSIS — C50919 Malignant neoplasm of unspecified site of unspecified female breast: Secondary | ICD-10-CM

## 2015-05-19 DIAGNOSIS — Z853 Personal history of malignant neoplasm of breast: Secondary | ICD-10-CM

## 2015-05-19 DIAGNOSIS — C50912 Malignant neoplasm of unspecified site of left female breast: Secondary | ICD-10-CM

## 2015-05-19 LAB — CBC WITH DIFFERENTIAL (CANCER CENTER ONLY)
BASO#: 0 10*3/uL (ref 0.0–0.2)
BASO%: 0.5 % (ref 0.0–2.0)
EOS%: 3 % (ref 0.0–7.0)
Eosinophils Absolute: 0.2 10*3/uL (ref 0.0–0.5)
HCT: 34.4 % — ABNORMAL LOW (ref 34.8–46.6)
HGB: 11 g/dL — ABNORMAL LOW (ref 11.6–15.9)
LYMPH#: 2.9 10*3/uL (ref 0.9–3.3)
LYMPH%: 36.2 % (ref 14.0–48.0)
MCH: 28.1 pg (ref 26.0–34.0)
MCHC: 32 g/dL (ref 32.0–36.0)
MCV: 88 fL (ref 81–101)
MONO#: 0.7 10*3/uL (ref 0.1–0.9)
MONO%: 8 % (ref 0.0–13.0)
NEUT#: 4.2 10*3/uL (ref 1.5–6.5)
NEUT%: 52.3 % (ref 39.6–80.0)
Platelets: 268 10*3/uL (ref 145–400)
RBC: 3.91 10*6/uL (ref 3.70–5.32)
RDW: 14 % (ref 11.1–15.7)
WBC: 8.1 10*3/uL (ref 3.9–10.0)

## 2015-05-19 NOTE — Progress Notes (Signed)
Hematology and Oncology Follow Up Visit  Rowynn Mcweeney Santos [id number] [birth year] 80 y.o. 05/19/2015   Principle Diagnosis:  Stage IIB (T2 N1 M0) lobular carcinoma of the left breast.  Current Therapy:    Observation     Interim History:  Ms.  Meghan Santos is back for her yearly followup. She is very kind and brought Meghan Santos some fudge. She always makes great fudge. She typically does this at Christmas..  She's been doing okay. Her blood sugars have been doing pretty well. She does watch this very carefully.  Since her last saw her, she has had 2 grandsons who got married.  She had cataract surgery back in March 2015. Everything is doing well with her vision.   She's had no problems with bone pain. She's had no problems with changes in bowel or bladder habits. She's had no leg swelling. There's been no rashes. She's had no change in medications.  Her last mammogram was done in December of last year. Everything looked okay.  Medications:  Current outpatient prescriptions:  .  aspirin 81 MG tablet, Take 81 mg by mouth., Disp: , Rfl:  .  benazepril-hydrochlorthiazide (LOTENSIN HCT) 10-12.5 MG per tablet, daily. , Disp: , Rfl:  .  Biotin 5000 MCG TABS, Take by mouth every morning., Disp: , Rfl:  .  Cholecalciferol (VITAMIN D) 2000 UNITS tablet, Take 2,000 Units by mouth daily., Disp: , Rfl:  .  Coenzyme Q10 (CO Q-10) 100 MG CAPS, Take 200 mg by mouth every morning. , Disp: , Rfl:  .  CRESTOR 20 MG tablet, 20 mg daily. , Disp: , Rfl:  .  Cyanocobalamin (VITAMIN B 12 PO), Take by mouth every morning., Disp: , Rfl:  .  diltiazem (TIAZAC) 240 MG 24 hr capsule, daily. , Disp: , Rfl:  .  furosemide (LASIX) 40 MG tablet, Take 2 tablets (80 mg total) by mouth daily. (Patient taking differently: Take 40 mg by mouth daily. ), Disp: 60 tablet, Rfl: 9 .  levothyroxine (SYNTHROID, LEVOTHROID) 125 MCG tablet, 125 mcg daily. , Disp: , Rfl:  .  metformin (FORTAMET) 1000 MG (OSM) 24 hr tablet, Take 1,000 mg by mouth  daily with breakfast. , Disp: , Rfl:  .  Multiple Vitamin (MULTI-VITAMIN DAILY PO), Take by mouth every morning., Disp: , Rfl:  .  potassium chloride SA (K-DUR,KLOR-CON) 20 MEQ tablet, Take 1 tablet (20 mEq total) by mouth daily., Disp: 30 tablet, Rfl: 6 .  venlafaxine XR (EFFEXOR-XR) 150 MG 24 hr capsule, Take 150 mg by mouth daily with breakfast., Disp: , Rfl:   Allergies: No Known Allergies  Past Medical History, Surgical history, Social history, and Family History were reviewed and updated.  Review of Systems: As above  Physical Exam:  height is 5\' 7"  (1.702 m) and weight is 174 lb (78.926 kg). Her oral temperature is 97.9 F (36.6 C). Her blood pressure is 134/61 and her pulse is 70. Her respiration is 16.   Well-developed and well-nourished white female. Head and neck exam shows no ocular or oral lesions. Lungs are clear. Cardiac exam regular in rhythm with no murmurs rubs or bruits. Breast exam shows right breast with no masses, edema or erythema. There is no right axillary adenopathy. Left breast shows a well-healed lumpectomy the 5:00 position. There's some slight retraction of the left breast from radiation surgery. Some firmness is noted about the lumpectomy. No distinct masses noted. There is no right axillary adenopathy. Abdomen is soft. She's good bowel sounds. There is no  palpable liver or spleen tip. Back exam shows no kyphosis. No tenderness is noted over the spine, ribs or hips. Extremities shows no clubbing, cyanosis or edema. Neurological exam shows no focal neurological deficits.  Lab Results  Component Value Date   WBC 8.1 05/19/2015   HGB 11.0* 05/19/2015   HCT 34.4* 05/19/2015   MCV 88 05/19/2015   PLT 268 05/19/2015     Chemistry      Component Value Date/Time   NA 136 01/11/2015 1352   NA 137 05/12/2013 1332   K 4.4 01/11/2015 1352   K 4.2 05/12/2013 1332   CL 98 01/11/2015 1352   CL 97* 05/12/2013 1332   CO2 22 01/11/2015 1352   CO2 28 05/12/2013 1332    BUN 15 01/11/2015 1352   BUN 10 05/12/2013 1332   CREATININE 0.86 01/11/2015 1352   CREATININE 0.78 05/14/2014 1428      Component Value Date/Time   CALCIUM 9.8 01/11/2015 1352   CALCIUM 9.4 05/12/2013 1332   ALKPHOS 60 01/11/2015 1513   ALKPHOS 56 05/12/2013 1332   AST 33 01/11/2015 1513   AST 60* 05/12/2013 1332   ALT 29 01/11/2015 1513   ALT 54* 05/12/2013 1332   BILITOT 0.4 01/11/2015 1513   BILITOT 0.50 05/12/2013 1332         Impression and Plan: Meghan Santos is 80 year old white female with a history of stage IIb lobular carcinoma the left breast. She was diagnosed back in 1998. She underwent systemic chemotherapy followed by Arimidex. She had radiation therapy.  She completed Arimidex when 2008.  I do not see any evidence of recurrent disease.  We will go ahead and plan to get her back in one or year.   [name], MD 8/31/20164:01 PM

## 2015-05-20 ENCOUNTER — Telehealth: Payer: Self-pay | Admitting: *Deleted

## 2015-05-20 LAB — COMPREHENSIVE METABOLIC PANEL
ALK PHOS: 60 U/L (ref 33–130)
ALT: 20 U/L (ref 6–29)
AST: 27 U/L (ref 10–35)
Albumin: 4.4 g/dL (ref 3.6–5.1)
BILIRUBIN TOTAL: 0.3 mg/dL (ref 0.2–1.2)
BUN: 15 mg/dL (ref 7–25)
CO2: 28 mmol/L (ref 20–31)
Calcium: 9.9 mg/dL (ref 8.6–10.4)
Chloride: 98 mmol/L (ref 98–110)
Creatinine, Ser: 0.85 mg/dL (ref 0.60–0.93)
GLUCOSE: 136 mg/dL — AB (ref 65–99)
Potassium: 4.3 mmol/L (ref 3.5–5.3)
Sodium: 137 mmol/L (ref 135–146)
Total Protein: 6.7 g/dL (ref 6.1–8.1)

## 2015-05-20 LAB — VITAMIN D 25 HYDROXY (VIT D DEFICIENCY, FRACTURES): Vit D, 25-Hydroxy: 43 ng/mL (ref 30–100)

## 2015-05-20 NOTE — Telephone Encounter (Addendum)
Patient aware of results  ----- Message from [name], MD sent at 05/20/2015  7:31 AM EDT ----- Call - vit D level is perfect!!  Meghan Santos

## 2015-08-04 ENCOUNTER — Other Ambulatory Visit: Payer: Self-pay

## 2015-08-04 DIAGNOSIS — Z1231 Encounter for screening mammogram for malignant neoplasm of breast: Secondary | ICD-10-CM

## 2015-08-09 ENCOUNTER — Other Ambulatory Visit: Payer: Self-pay | Admitting: Cardiovascular Disease

## 2015-08-19 ENCOUNTER — Ambulatory Visit (INDEPENDENT_AMBULATORY_CARE_PROVIDER_SITE_OTHER): Payer: Medicare Other | Admitting: Cardiovascular Disease

## 2015-08-19 ENCOUNTER — Encounter: Payer: Self-pay | Admitting: Cardiovascular Disease

## 2015-08-19 VITALS — BP 130/62 | HR 71 | Ht 66.5 in | Wt 176.0 lb

## 2015-08-19 DIAGNOSIS — E669 Obesity, unspecified: Secondary | ICD-10-CM

## 2015-08-19 DIAGNOSIS — I5032 Chronic diastolic (congestive) heart failure: Secondary | ICD-10-CM | POA: Diagnosis not present

## 2015-08-19 DIAGNOSIS — E7800 Pure hypercholesterolemia, unspecified: Secondary | ICD-10-CM

## 2015-08-19 DIAGNOSIS — I10 Essential (primary) hypertension: Secondary | ICD-10-CM

## 2015-08-19 NOTE — Patient Instructions (Addendum)
Dr. Sallyanne Kuster recommends that you schedule a follow-up appointment in:  [place]!!

## 2015-08-19 NOTE — Progress Notes (Signed)
Patient ID: Meghan Santos, female   DOB: July 26, 1935, 80 y.o.   MRN: [id number]     Cardiology Office Note   Date:  08/19/2015   ID:  Meghan Santos, DOB December 22, 1934, MRN [id number]  PCP:  Jerlyn Ly, MD  Cardiologist:   Sanda Klein, MD   Chief Complaint  Patient presents with  . Follow-up     no chest pain, occassional shortness of breath, no edema, no pain in legs, no cramping in legs, no lightheadedness, no dizziness      History of Present Illness: Meghan Santos is a 80 y.o. female who presents for  Follow-up for hypertension and diastolic heart failure. She has done very well since her last appointment a year ago. She has not had any difficulty with shortness of breath or lower extremity edema. She had laboratory tests performed not long ago with Dr. Joylene Draft and he was very satisfied with her lipid profile and hemoglobin A1c levels.   She has occasional lower extremity edema if she stands or sits for long periods of time and has prominent varicose veins. She does not have any other symptoms of cardiovascular illness and specifically denies dyspnea, angina, syncope, palpitations, dizziness, focal neurological complaints or claudication.  Although a clear cause of diastolic heart failure was not identified, her echo that show a borderline left ventricular ejection fraction of 50-55 percent, very mild aortic stenosis and Doppler evidence of grade 2 diastolic dysfunction. Her nuclear stress test showed a very small region of moderate apical ischemia but she did not undergo coronary angiography. She has never had angina pectoris. She does have a history of chest radiation therapy and adjuvant chemotherapy that included Adriamycin for breast cancer in 1998.   she has done quite well with conservative treatment with diuretics and continues to prefer a noninvasive course of evaluation and treatment.  Past Medical History  Diagnosis Date  . Breast cancer ([place]) 1998    left breast with  radiation and chemo  . Hypertension   . Hypercholesterolemia   . Diabetes ([place])   . Essential hypertension 09/05/2014  . Hypercholesteremia 09/05/2014  . Mild obesity 09/05/2014    Past Surgical History  Procedure Laterality Date  . Breast lumpectomy  1998    left breast  . Vaginal hysterectomy  1983  . Tonsillectomy  1958     Current Outpatient Prescriptions  Medication Sig Dispense Refill  . aspirin 81 MG tablet Take 81 mg by mouth.    . benazepril-hydrochlorthiazide (LOTENSIN HCT) 10-12.5 MG per tablet daily.     . Biotin 5000 MCG TABS Take by mouth every morning.    . Cholecalciferol (VITAMIN D) 2000 UNITS tablet Take 2,000 Units by mouth daily.    . Coenzyme Q10 (CO Q-10) 100 MG CAPS Take 200 mg by mouth every morning.     Marland Kitchen CRESTOR 20 MG tablet 20 mg daily.     . Cyanocobalamin (VITAMIN B 12 PO) Take by mouth every morning.    . diltiazem (TIAZAC) 240 MG 24 hr capsule daily.     . furosemide (LASIX) 40 MG tablet Take 2 tablets (80 mg total) by mouth daily. (Patient taking differently: Take 40 mg by mouth daily. ) 60 tablet 9  . levothyroxine (SYNTHROID, LEVOTHROID) 125 MCG tablet 125 mcg daily.     . metformin (FORTAMET) 1000 MG (OSM) 24 hr tablet Take 1,000 mg by mouth daily with breakfast.     . Multiple Vitamin (MULTI-VITAMIN DAILY PO) Take by mouth every morning.    Marland Kitchen  potassium chloride SA (K-DUR,KLOR-CON) 20 MEQ tablet TAKE 1 TABLET(20 MEQ) BY MOUTH DAILY 90 tablet 1  . venlafaxine XR (EFFEXOR-XR) 150 MG 24 hr capsule Take 150 mg by mouth daily with breakfast.     No current facility-administered medications for this visit.    Allergies:   Review of patient's allergies indicates no known allergies.    Social History:  The patient  reports that she has never smoked. She has never used smokeless tobacco. She reports that she drinks alcohol. She reports that she does not use illicit drugs.   Family History:  The patient's family history includes Alcoholism in her  brother; Cancer in her sister; Dementia in her father; Diabetes in her brother; Heart attack in her mother.    ROS:  Please see the history of present illness.    Otherwise, review of systems positive for none.   All other systems are reviewed and negative.    PHYSICAL EXAM: VS:  BP 130/62 mmHg  Pulse 71  Ht 5' 6.5" (1.689 m)  Wt 176 lb (79.833 kg)  BMI 27.98 kg/m2 , BMI Body mass index is 27.98 kg/(m^2).  BP right arm 133/62, left arm 122/61 General: Alert, oriented x3, no distress Head: no evidence of trauma, PERRL, EOMI, no exophtalmos or lid lag, no myxedema, no xanthelasma; normal ears, nose and oropharynx Neck: normal jugular venous pulsations and no hepatojugular reflux; brisk carotid pulses without delay and no carotid bruits Chest: clear to auscultation, no signs of consolidation by percussion or palpation, normal fremitus, symmetrical and full respiratory excursions Cardiovascular: normal position and quality of the apical impulse, regular rhythm, normal first and second heart sounds, 1-2/6 early peaking systolic ejection murmur in the aortic focus, no diastolic murmurs, rubs or gallops Abdomen: no tenderness or distention, no masses by palpation, no abnormal pulsatility or arterial bruits, normal bowel sounds, no hepatosplenomegaly Extremities: no clubbing, cyanosis or edema; prominent varicose veins of both calves; 2+ radial, ulnar and brachial pulses bilaterally; 2+ right femoral, posterior tibial and dorsalis pedis pulses; 2+ left femoral, posterior tibial and dorsalis pedis pulses; no subclavian or femoral bruits Neurological: grossly nonfocal Psych: euthymic mood, full affect   EKG:  EKG is ordered today. The ekg ordered today demonstrates  Normal sinus rhythm, borderline QTC 465 ms   Recent Labs: 05/19/2015: ALT 20; BUN 15; Creatinine, Ser 0.85; HGB 11.0*; Platelets 268; Potassium 4.3; Sodium 137    Lipid Panel No results found for: CHOL, TRIG, HDL, CHOLHDL, VLDL,  LDLCALC, LDLDIRECT    Wt Readings from Last 3 Encounters:  08/19/15 176 lb (79.833 kg)  05/19/15 174 lb (78.926 kg)  03/03/15 175 lb (79.379 kg)      ASSESSMENT AND PLAN:  Chronic heart failure with preserved ejection fraction , well compensated (NYHA functional class I), euvolemic on current diuretic dose. She may have diastolic dysfunction related to previous chest radiation therapy (maybe a combination of constrictive pericarditis and restrictive cardiomyopathy? ). Cannot exclude that she has coronary artery disease since she has numerous coronary risk factors (hypertension, diabetes, hyperlipidemia , previous chest radiation therapy). In the absence of angina pectoris and with preserved left ventricular systolic function is quite reasonable to proceed with current course of medical therapy , which is deathly her preference.   we will retrieve her more recent lipid profile and other labs from Dr. Joylene Draft. In the absence of a confident diagnosis of coronary artery disease with continue with aspirin therapy and aggressive risk factor modification including a target LDL cholesterol  less than 100 mg/dL (preferably less than 70).  Reminded her that it is important to continue with sodium restriction and daily weight monitoring    Current medicines are reviewed at length with the patient today.  The patient does not have concerns regarding medicines.  The following changes have been made:  no change  Labs/ tests ordered today include:  No orders of the defined types were placed in this encounter.     Patient Instructions  Dr. Sallyanne Kuster recommends that you schedule a follow-up appointment in:  [place]!!      Mikael Spray, MD  08/19/2015 6:12 PM    Sanda Klein, MD, [facility] [phone] office [phone] pager

## 2015-08-30 ENCOUNTER — Ambulatory Visit: Payer: Medicare Other | Admitting: Cardiovascular Disease

## 2015-09-09 ENCOUNTER — Ambulatory Visit
Admission: RE | Admit: 2015-09-09 | Discharge: 2015-09-09 | Disposition: A | Discharge status: Home / Self Care | Payer: Medicare Other | Source: Ambulatory Visit

## 2015-09-09 DIAGNOSIS — Z1231 Encounter for screening mammogram for malignant neoplasm of breast: Secondary | ICD-10-CM

## 2015-10-28 DIAGNOSIS — R509 Fever, unspecified: Secondary | ICD-10-CM | POA: Diagnosis not present

## 2015-10-28 DIAGNOSIS — Z6829 Body mass index (BMI) 29.0-29.9, adult: Secondary | ICD-10-CM | POA: Diagnosis not present

## 2015-10-28 DIAGNOSIS — J209 Acute bronchitis, unspecified: Secondary | ICD-10-CM | POA: Diagnosis not present

## 2015-11-05 ENCOUNTER — Other Ambulatory Visit: Payer: Self-pay | Admitting: Cardiovascular Disease

## 2015-11-05 NOTE — Telephone Encounter (Signed)
Rx(s) sent to pharmacy electronically.  

## 2016-02-01 DIAGNOSIS — I5189 Other ill-defined heart diseases: Secondary | ICD-10-CM | POA: Diagnosis not present

## 2016-02-01 DIAGNOSIS — Z6829 Body mass index (BMI) 29.0-29.9, adult: Secondary | ICD-10-CM | POA: Diagnosis not present

## 2016-02-01 DIAGNOSIS — E119 Type 2 diabetes mellitus without complications: Secondary | ICD-10-CM | POA: Diagnosis not present

## 2016-02-01 DIAGNOSIS — E038 Other specified hypothyroidism: Secondary | ICD-10-CM | POA: Diagnosis not present

## 2016-02-01 DIAGNOSIS — E784 Other hyperlipidemia: Secondary | ICD-10-CM | POA: Diagnosis not present

## 2016-02-01 DIAGNOSIS — F418 Other specified anxiety disorders: Secondary | ICD-10-CM | POA: Diagnosis not present

## 2016-02-04 ENCOUNTER — Encounter: Payer: Medicare Other | Attending: Internal Medicine | Admitting: Dietician

## 2016-02-04 ENCOUNTER — Encounter: Payer: Self-pay | Admitting: Dietician

## 2016-02-04 VITALS — Ht 66.5 in | Wt 182.0 lb

## 2016-02-04 DIAGNOSIS — E119 Type 2 diabetes mellitus without complications: Secondary | ICD-10-CM | POA: Diagnosis not present

## 2016-02-04 NOTE — Progress Notes (Signed)
Diabetes Self-Management Education  Visit Type: First/Initial  Appt. Start Time: 1510 Appt. End Time: 1630  02/04/2016  [name], identified by name and date of birth, is a 81 y.o. female with a diagnosis of Diabetes: Type 2.   Patient is here today with her husband.  She has had type 2 diabetes for about 20 years without medication requirements until recently.  Other hx includes:  HTN, Hyperlipidemia, history of breast cancer in 1998, Hx of vitamin B-12 deficiency.  She takes Metformin for her diabetes and was started on Januvia this week.  She drinks aloe which helps with her constipation.  She lives with her husband and they share cooking and shopping responsibilities.  She considers herself a picky eater and will not eat [place], chicken, cream cheese and other foods.  Weight hx:   Low 157 lbs prior to cancer in 1998. High 182 lbs which is current weight. She had followed a Forum 3 diet in the past and lost weight. She lost 20 lbs of fluid last summer when she was put on a fluid pill.    ASSESSMENT  Height 5' 6.5" (1.689 m), weight 182 lb (82.555 kg). Body mass index is 28.94 kg/(m^2).      Diabetes Self-Management Education - 02/04/16 1528    Visit Information   Visit Type First/Initial   Initial Visit   Diabetes Type Type 2   Are you currently following a meal plan? No   Are you taking your medications as prescribed? Yes   Date Diagnosed about 20 years ago   Health Coping   How would you rate your overall health? Excellent   Psychosocial Assessment   Patient Belief/Attitude about Diabetes Other (comment)  frustrated   Self-care barriers None   Self-management support Doctor's office;Friends   Other persons present Patient;Family Member   Patient Concerns Nutrition/Meal planning;Weight Control   Special Needs None   Preferred Learning Style Hands on   Learning Readiness Ready   How often do you need to have someone help you when you read instructions,  pamphlets, or other written materials from your doctor or pharmacy? 1 - Never   What is the last grade level you completed in school? 1 year college   Pre-Education Assessment   Patient understands the diabetes disease and treatment process. Needs Instruction   Patient understands incorporating nutritional management into lifestyle. Needs Review   Patient undertands incorporating physical activity into lifestyle. Needs Review   Patient understands using medications safely. Demonstrates understanding / competency   Patient understands monitoring blood glucose, interpreting and using results Needs Review   Patient understands prevention, detection, and treatment of acute complications. Needs Review   Patient understands prevention, detection, and treatment of chronic complications. Needs Review   Patient understands how to develop strategies to address psychosocial issues. Needs Review   Patient understands how to develop strategies to promote health/change behavior. Needs Review   Complications   Last HgB A1C per patient/outside source 7.3 %  02/02/16   How often do you check your blood sugar? 0 times/day (not testing)  occasionally   Fasting Blood glucose range (mg/dL) 130-179   Postprandial Blood glucose range (mg/dL) 70-129   Number of hyperglycemic episodes per week 1   Can you tell when your blood sugar is high? No   Have you had a dilated eye exam in the past 12 months? Yes   Have you had a dental exam in the past 12 months? Yes   Are you checking  your feet? Yes   How many days per week are you checking your feet? 7   Dietary Intake   Breakfast graham cracker or rice cake or 1/2 mini bagel or bran cereal and milk and coffee with spenda and lite coffeemate- used to eat a kind bar   Snack (morning) clementine   Lunch leftover pizza OR leftovers OR open faced sandwich (tomato, mayo, lettuce) and fruit or sugar free jello   Snack (afternoon) skinny popcorn   Dinner spaghetti OR pizza OR  cabbage and cornbread and pintos   Snack (evening) nuts (almonds) or skinny popcorn   Beverage(s) water, occasional wine   Exercise   Exercise Type ADL's  enjoys yard work, stationary bike   How many days per week to you exercise? 0   How many minutes per day do you exercise? 0   Total minutes per week of exercise 0   Patient Education   Previous Diabetes Education No   Disease state  Definition of diabetes, type 1 and 2, and the diagnosis of diabetes   Nutrition management  Role of diet in the treatment of diabetes and the relationship between the three main macronutrients and blood glucose level;Food label reading, portion sizes and measuring food.;Meal options for control of blood glucose level and chronic complications.;Information on hints to eating out and maintain blood glucose control.   Physical activity and exercise  Role of exercise on diabetes management, blood pressure control and cardiac health.;Helped patient identify appropriate exercises in relation to his/her diabetes, diabetes complications and other health issue.   Medications Reviewed patients medication for diabetes, action, purpose, timing of dose and side effects.   Monitoring Purpose and frequency of SMBG.;Identified appropriate SMBG and/or A1C goals.   Chronic complications Relationship between chronic complications and blood glucose control   Psychosocial adjustment Worked with patient to identify barriers to care and solutions;Role of stress on diabetes;Identified and addressed patients feelings and concerns about diabetes   Personal strategies to promote health Lifestyle issues that need to be addressed for better diabetes care   Individualized Goals (developed by patient)   Nutrition General guidelines for healthy choices and portions discussed   Physical Activity Exercise 3-5 times per week;30 minutes per day   Medications take my medication as prescribed   Monitoring  test my blood glucose as discussed    Post-Education Assessment   Patient understands the diabetes disease and treatment process. Demonstrates understanding / competency   Patient understands incorporating nutritional management into lifestyle. Demonstrates understanding / competency   Patient undertands incorporating physical activity into lifestyle. Demonstrates understanding / competency   Patient understands using medications safely. Demonstrates understanding / competency   Patient understands monitoring blood glucose, interpreting and using results Demonstrates understanding / competency   Patient understands prevention, detection, and treatment of acute complications. Demonstrates understanding / competency   Patient understands prevention, detection, and treatment of chronic complications. Demonstrates understanding / competency   Patient understands how to develop strategies to address psychosocial issues. Demonstrates understanding / competency   Patient understands how to develop strategies to promote health/change behavior. Demonstrates understanding / competency   Outcomes   Expected Outcomes Demonstrated interest in learning. Expect positive outcomes   Future DMSE PRN   Program Status Completed      Individualized Plan for Diabetes Self-Management Training:   Learning Objective:  Patient will have a greater understanding of diabetes self-management. Patient education plan is to attend individual and/or group sessions per assessed needs and concerns.   Plan:  Patient Instructions  Be as active as possible.  Aim for 30 minutes most days. Aim for 1/2 of your plate to be non starchy vegetables. Add small amount of protein with each meal. Avoid skipping meals. Avoid added salt.  Choose lower sodium foods most often. Consider the Calorie [employer] Pace App  Plan:  Aim for 2-3 Carb Choices per meal (30-45 grams) +/- 1 either way  Aim for 0-1 Carbs per snack if hungry  Include protein in moderation with your meals and  snacks Consider reading food labels for Total Carbohydrate and Fat Grams of foods Consider checking your blood sugar 3 times per week at alternating times.  (before breakfast or 2 hours after a meal) Consider taking medication as directed by MD  Expected Outcomes:  Demonstrated interest in learning. Expect positive outcomes  Education material provided: Living Well with Diabetes, Food label handouts, A1C conversion sheet, Meal plan card, My Plate and Snack sheet, breakfast ideas  If problems or questions, patient to contact team via:  Phone and Email  Future DSME appointment: PRN

## 2016-02-04 NOTE — Addendum Note (Signed)
Addended by: Clydell Hakim on: 02/04/2016 05:38 PM   Modules accepted: Medications

## 2016-02-04 NOTE — Patient Instructions (Signed)
Be as active as possible.  Aim for 30 minutes most days. Aim for 1/2 of your plate to be non starchy vegetables. Add small amount of protein with each meal. Avoid skipping meals. Avoid added salt.  Choose lower sodium foods most often. Consider the Calorie [employer] Pace App  Plan:  Aim for 2-3 Carb Choices per meal (30-45 grams) +/- 1 either way  Aim for 0-1 Carbs per snack if hungry  Include protein in moderation with your meals and snacks Consider reading food labels for Total Carbohydrate and Fat Grams of foods Consider checking your blood sugar 3 times per week at alternating times.  (before breakfast or 2 hours after a meal) Consider taking medication as directed by MD

## 2016-02-07 DIAGNOSIS — E119 Type 2 diabetes mellitus without complications: Secondary | ICD-10-CM | POA: Diagnosis not present

## 2016-02-08 DIAGNOSIS — H5213 Myopia, bilateral: Secondary | ICD-10-CM | POA: Diagnosis not present

## 2016-03-12 ENCOUNTER — Other Ambulatory Visit: Payer: Self-pay | Admitting: Cardiovascular Disease

## 2016-03-13 NOTE — Telephone Encounter (Signed)
Rx(s) sent to pharmacy electronically.  

## 2016-04-18 DIAGNOSIS — M859 Disorder of bone density and structure, unspecified: Secondary | ICD-10-CM | POA: Diagnosis not present

## 2016-04-18 DIAGNOSIS — E538 Deficiency of other specified B group vitamins: Secondary | ICD-10-CM | POA: Diagnosis not present

## 2016-04-18 DIAGNOSIS — R8299 Other abnormal findings in urine: Secondary | ICD-10-CM | POA: Diagnosis not present

## 2016-04-18 DIAGNOSIS — E119 Type 2 diabetes mellitus without complications: Secondary | ICD-10-CM | POA: Diagnosis not present

## 2016-04-18 DIAGNOSIS — E038 Other specified hypothyroidism: Secondary | ICD-10-CM | POA: Diagnosis not present

## 2016-04-18 DIAGNOSIS — N390 Urinary tract infection, site not specified: Secondary | ICD-10-CM | POA: Diagnosis not present

## 2016-04-18 DIAGNOSIS — E784 Other hyperlipidemia: Secondary | ICD-10-CM | POA: Diagnosis not present

## 2016-04-18 DIAGNOSIS — I10 Essential (primary) hypertension: Secondary | ICD-10-CM | POA: Diagnosis not present

## 2016-04-25 DIAGNOSIS — I10 Essential (primary) hypertension: Secondary | ICD-10-CM | POA: Diagnosis not present

## 2016-04-25 DIAGNOSIS — D649 Anemia, unspecified: Secondary | ICD-10-CM | POA: Diagnosis not present

## 2016-04-25 DIAGNOSIS — E119 Type 2 diabetes mellitus without complications: Secondary | ICD-10-CM | POA: Diagnosis not present

## 2016-04-25 DIAGNOSIS — D509 Iron deficiency anemia, unspecified: Secondary | ICD-10-CM | POA: Diagnosis not present

## 2016-04-25 DIAGNOSIS — R413 Other amnesia: Secondary | ICD-10-CM | POA: Diagnosis not present

## 2016-04-25 DIAGNOSIS — I350 Nonrheumatic aortic (valve) stenosis: Secondary | ICD-10-CM | POA: Diagnosis not present

## 2016-04-25 DIAGNOSIS — E784 Other hyperlipidemia: Secondary | ICD-10-CM | POA: Diagnosis not present

## 2016-04-25 DIAGNOSIS — Z0000 Encounter for general adult medical examination without abnormal findings: Secondary | ICD-10-CM | POA: Diagnosis not present

## 2016-04-25 DIAGNOSIS — I5189 Other ill-defined heart diseases: Secondary | ICD-10-CM | POA: Diagnosis not present

## 2016-04-25 DIAGNOSIS — C50919 Malignant neoplasm of unspecified site of unspecified female breast: Secondary | ICD-10-CM | POA: Diagnosis not present

## 2016-04-25 DIAGNOSIS — Z1231 Encounter for screening mammogram for malignant neoplasm of breast: Secondary | ICD-10-CM | POA: Diagnosis not present

## 2016-05-18 ENCOUNTER — Encounter: Payer: Self-pay | Admitting: Hematology & Oncology

## 2016-05-18 ENCOUNTER — Ambulatory Visit (HOSPITAL_BASED_OUTPATIENT_CLINIC_OR_DEPARTMENT_OTHER): Payer: Medicare Other | Admitting: Hematology & Oncology

## 2016-05-18 ENCOUNTER — Other Ambulatory Visit (HOSPITAL_BASED_OUTPATIENT_CLINIC_OR_DEPARTMENT_OTHER): Payer: Medicare Other

## 2016-05-18 VITALS — BP 118/70 | HR 75 | Temp 97.9°F | Resp 16 | Ht 65.5 in | Wt 179.0 lb

## 2016-05-18 DIAGNOSIS — Z853 Personal history of malignant neoplasm of breast: Secondary | ICD-10-CM

## 2016-05-18 DIAGNOSIS — D500 Iron deficiency anemia secondary to blood loss (chronic): Secondary | ICD-10-CM

## 2016-05-18 DIAGNOSIS — D509 Iron deficiency anemia, unspecified: Secondary | ICD-10-CM

## 2016-05-18 DIAGNOSIS — R5383 Other fatigue: Secondary | ICD-10-CM

## 2016-05-18 DIAGNOSIS — C50912 Malignant neoplasm of unspecified site of left female breast: Secondary | ICD-10-CM

## 2016-05-18 LAB — CMP (CANCER CENTER ONLY)
ALT(SGPT): 28 U/L (ref 10–47)
AST: 40 U/L — ABNORMAL HIGH (ref 11–38)
Albumin: 3.8 g/dL (ref 3.3–5.5)
Alkaline Phosphatase: 55 U/L (ref 26–84)
BUN, Bld: 23 mg/dL — ABNORMAL HIGH (ref 7–22)
CALCIUM: 10.1 mg/dL (ref 8.0–10.3)
CHLORIDE: 101 meq/L (ref 98–108)
CO2: 31 mEq/L (ref 18–33)
Creat: 1 mg/dl (ref 0.6–1.2)
Glucose, Bld: 117 mg/dL (ref 73–118)
POTASSIUM: 4.3 meq/L (ref 3.3–4.7)
Sodium: 137 mEq/L (ref 128–145)
TOTAL PROTEIN: 7.1 g/dL (ref 6.4–8.1)
Total Bilirubin: 0.6 mg/dl (ref 0.20–1.60)

## 2016-05-18 LAB — CBC WITH DIFFERENTIAL (CANCER CENTER ONLY)
BASO#: 0 10*3/uL (ref 0.0–0.2)
BASO%: 0.6 % (ref 0.0–2.0)
EOS ABS: 0.2 10*3/uL (ref 0.0–0.5)
EOS%: 3 % (ref 0.0–7.0)
HCT: 33.7 % — ABNORMAL LOW (ref 34.8–46.6)
HGB: 10.7 g/dL — ABNORMAL LOW (ref 11.6–15.9)
LYMPH#: 2.1 10*3/uL (ref 0.9–3.3)
LYMPH%: 39.7 % (ref 14.0–48.0)
MCH: 26.4 pg (ref 26.0–34.0)
MCHC: 31.8 g/dL — ABNORMAL LOW (ref 32.0–36.0)
MCV: 83 fL (ref 81–101)
MONO#: 0.5 10*3/uL (ref 0.1–0.9)
MONO%: 9.7 % (ref 0.0–13.0)
NEUT#: 2.5 10*3/uL (ref 1.5–6.5)
NEUT%: 47 % (ref 39.6–80.0)
PLATELETS: 231 10*3/uL (ref 145–400)
RBC: 4.06 10*6/uL (ref 3.70–5.32)
RDW: 18.2 % — ABNORMAL HIGH (ref 11.1–15.7)
WBC: 5.3 10*3/uL (ref 3.9–10.0)

## 2016-05-18 NOTE — Progress Notes (Signed)
Hematology and Oncology Follow Up Visit  Meghan Santos [id number] [birth year] 81 y.o. 05/18/2016   Principle Diagnosis:  Stage IIB (T2 N1 M0) lobular carcinoma of the left breast.  Iron deficiency anemia  Current Therapy:    Observation     Interim History:  Ms.  Santos is back for her yearly followup. Surprisingly enough, she is not feeling well. She has felt very tired. She does not have a lot of energy. She is not able to do what she normally has done in the past.  She saw her family doctor. Apparently, she had iron deficiency. I'm not sure why she would have iron deficiency since she does not have monthly cycles. She probably will need to have some type of evaluation for this. I'm sending iron studies today.  She has had no fever. She's had no weight loss or weight gain. I know that she does have hypothyroidism. I'm sure this is being checked.  There's been no change in bowel or bladder habits. She's had no nausea or vomiting.   Her last mammogram was done in December of last year. Everything looked okay.  Medications:  Current Outpatient Prescriptions:  .  aspirin 81 MG tablet, Take 81 mg by mouth., Disp: , Rfl:  .  benazepril-hydrochlorthiazide (LOTENSIN HCT) 10-12.5 MG per tablet, daily. , Disp: , Rfl:  .  Cholecalciferol (VITAMIN D) 2000 UNITS tablet, Take 2,000 Units by mouth daily., Disp: , Rfl:  .  Coenzyme Q10 (CO Q-10) 100 MG CAPS, Take 200 mg by mouth every morning. , Disp: , Rfl:  .  CRESTOR 20 MG tablet, 20 mg daily. , Disp: , Rfl:  .  Cyanocobalamin (VITAMIN B 12 PO), Take by mouth every morning., Disp: , Rfl:  .  escitalopram (LEXAPRO) 10 MG tablet, Take 10 mg by mouth daily., Disp: , Rfl:  .  Ferrous Sulfate (IRON) 325 (65 Fe) MG TABS, TK 1 T PO QD, Disp: , Rfl: 6 .  furosemide (LASIX) 40 MG tablet, Take 2 tablets (80 mg total) by mouth daily., Disp: 180 tablet, Rfl: 2 .  levothyroxine (SYNTHROID, LEVOTHROID) 125 MCG tablet, 125 mcg daily. , Disp: , Rfl:  .   LORazepam (ATIVAN) 1 MG tablet, , Disp: , Rfl:  .  metformin (FORTAMET) 1000 MG (OSM) 24 hr tablet, Take 1,000 mg by mouth daily with breakfast. , Disp: , Rfl:  .  Multiple Vitamin (MULTI-VITAMIN DAILY PO), Take by mouth every morning., Disp: , Rfl:  .  potassium chloride SA (K-DUR,KLOR-CON) 20 MEQ tablet, TAKE 1 TABLET(20 MEQ) BY MOUTH DAILY, Disp: 90 tablet, Rfl: 2 .  sitaGLIPtin (JANUVIA) 100 MG tablet, Take 100 mg by mouth daily. Unknown dose, Disp: , Rfl:   Allergies: No Known Allergies  Past Medical History, Surgical history, Social history, and Family History were reviewed and updated.  Review of Systems: As above  Physical Exam:  height is 5' 5.5" (1.664 m) and weight is 179 lb (81.2 kg). Her oral temperature is 97.9 F (36.6 C). Her blood pressure is 118/70 and her pulse is 75. Her respiration is 16.   Well-developed and well-nourished white female. Head and neck exam shows no ocular or oral lesions. Lungs are clear. Cardiac exam regular in rhythm with no murmurs rubs or bruits. Breast exam shows right breast with no masses, edema or erythema. There is no right axillary adenopathy. Left breast shows a well-healed lumpectomy the 5:00 position. There's some slight retraction of the left breast from radiation surgery. Some firmness  is noted about the lumpectomy. No distinct masses noted. There is no right axillary adenopathy. Abdomen is soft. She's good bowel sounds. There is no palpable liver or spleen tip. Back exam shows no kyphosis. No tenderness is noted over the spine, ribs or hips. Extremities shows no clubbing, cyanosis or edema. Neurological exam shows no focal neurological deficits.  Lab Results  Component Value Date   WBC 5.3 05/18/2016   HGB 10.7 (L) 05/18/2016   HCT 33.7 (L) 05/18/2016   MCV 83 05/18/2016   PLT 231 05/18/2016     Chemistry      Component Value Date/Time   NA 137 05/18/2016 1417   K 4.3 05/18/2016 1417   CL 101 05/18/2016 1417   CO2 31 05/18/2016  1417   BUN 23 (H) 05/18/2016 1417   CREATININE 1.0 05/18/2016 1417      Component Value Date/Time   CALCIUM 10.1 05/18/2016 1417   ALKPHOS 55 05/18/2016 1417   AST 40 (H) 05/18/2016 1417   ALT 28 05/18/2016 1417   BILITOT 0.60 05/18/2016 1417         Impression and Plan: Meghan Santos is 81 year old white female with a history of stage IIb lobular carcinoma the left breast. She was diagnosed back in 1998. She underwent systemic chemotherapy followed by Arimidex. She had radiation therapy.  She completed Arimidex when 2008.  I do not see any evidence of recurrent disease.  The real problem now is why she has this fatigue. We will have to check her iron studies. Again if she is iron deficient, we will have to probably get some, GI evaluation. She probably needs to have her stools checked for blood.  I told her that if she is iron deficient, that we could give her some IV iron. I'm sure this would help out.  This is a little more complicated than I would have thought. We will have to follow her closely. I'm sure that I will have to get her back in a month or less depending on what our labs show.  I spent about 30 minutes with her today.  [name], MD 8/31/20175:32 PM

## 2016-05-19 LAB — IRON AND TIBC
%SAT: 16 % — ABNORMAL LOW (ref 21–57)
IRON: 67 ug/dL (ref 41–142)
TIBC: 413 ug/dL (ref 236–444)
UIBC: 346 ug/dL (ref 120–384)

## 2016-05-19 LAB — CHCC SATELLITE - SMEAR

## 2016-05-19 LAB — FERRITIN: FERRITIN: 10 ng/mL (ref 9–269)

## 2016-05-24 NOTE — Addendum Note (Signed)
Addended by: Burney Gauze R on: 05/24/2016 05:09 PM   Modules accepted: Orders

## 2016-05-25 ENCOUNTER — Telehealth: Payer: Self-pay | Admitting: Nurse Practitioner

## 2016-05-25 NOTE — Telephone Encounter (Addendum)
Patient will call back and speak to scheduling to schedule this appointment ----- Message from [name], MD sent at 05/24/2016  4:43 PM EDT ----- Call - Iron level is low!!  Need 1 dose of IV iron.  Please set up!!  When she is here, we MUST do a rectal exam on her for bleeding!!! pete

## 2016-05-31 ENCOUNTER — Other Ambulatory Visit: Payer: Self-pay | Admitting: Family

## 2016-05-31 ENCOUNTER — Ambulatory Visit (HOSPITAL_BASED_OUTPATIENT_CLINIC_OR_DEPARTMENT_OTHER): Payer: Medicare Other

## 2016-05-31 VITALS — BP 118/56 | HR 52 | Temp 98.2°F | Resp 16

## 2016-05-31 DIAGNOSIS — D509 Iron deficiency anemia, unspecified: Secondary | ICD-10-CM | POA: Insufficient documentation

## 2016-05-31 MED ORDER — SODIUM CHLORIDE 0.9 % IV SOLN
510.0000 mg | Freq: Once | INTRAVENOUS | Status: AC
  Administered 2016-05-31: 510 mg via INTRAVENOUS
  Filled 2016-05-31: qty 17

## 2016-05-31 MED ORDER — SODIUM CHLORIDE 0.9 % IV SOLN
Freq: Once | INTRAVENOUS | Status: AC
  Administered 2016-05-31: 14:00:00 via INTRAVENOUS

## 2016-05-31 NOTE — Patient Instructions (Signed)

## 2016-06-22 DIAGNOSIS — Z23 Encounter for immunization: Secondary | ICD-10-CM | POA: Diagnosis not present

## 2016-07-06 ENCOUNTER — Other Ambulatory Visit (HOSPITAL_BASED_OUTPATIENT_CLINIC_OR_DEPARTMENT_OTHER): Payer: Medicare Other

## 2016-07-06 ENCOUNTER — Telehealth: Payer: Self-pay

## 2016-07-06 ENCOUNTER — Ambulatory Visit (HOSPITAL_BASED_OUTPATIENT_CLINIC_OR_DEPARTMENT_OTHER): Payer: Medicare Other | Admitting: Hematology & Oncology

## 2016-07-06 ENCOUNTER — Ambulatory Visit: Payer: Medicare Other

## 2016-07-06 ENCOUNTER — Encounter: Payer: Self-pay | Admitting: Hematology & Oncology

## 2016-07-06 VITALS — BP 118/67 | HR 77 | Temp 98.0°F | Resp 16 | Ht 65.5 in | Wt 182.8 lb

## 2016-07-06 DIAGNOSIS — D500 Iron deficiency anemia secondary to blood loss (chronic): Secondary | ICD-10-CM | POA: Diagnosis not present

## 2016-07-06 DIAGNOSIS — Z853 Personal history of malignant neoplasm of breast: Secondary | ICD-10-CM

## 2016-07-06 LAB — COMPREHENSIVE METABOLIC PANEL
ALT: 23 U/L (ref 0–55)
ANION GAP: 11 meq/L (ref 3–11)
AST: 31 U/L (ref 5–34)
Albumin: 4 g/dL (ref 3.5–5.0)
Alkaline Phosphatase: 73 U/L (ref 40–150)
BUN: 20.4 mg/dL (ref 7.0–26.0)
CALCIUM: 10.9 mg/dL — AB (ref 8.4–10.4)
CHLORIDE: 101 meq/L (ref 98–109)
CO2: 29 mEq/L (ref 22–29)
Creatinine: 0.9 mg/dL (ref 0.6–1.1)
EGFR: 61 mL/min/{1.73_m2} — ABNORMAL LOW (ref >90)
Glucose: 74 mg/dl (ref 70–140)
POTASSIUM: 3.9 meq/L (ref 3.5–5.1)
Sodium: 141 mEq/L (ref 136–145)
Total Bilirubin: 0.35 mg/dL (ref 0.20–1.20)
Total Protein: 7.8 g/dL (ref 6.4–8.3)

## 2016-07-06 LAB — CBC WITH DIFFERENTIAL (CANCER CENTER ONLY)
BASO#: 0 10*3/uL (ref 0.0–0.2)
BASO%: 0.6 % (ref 0.0–2.0)
EOS ABS: 0.2 10*3/uL (ref 0.0–0.5)
EOS%: 3.5 % (ref 0.0–7.0)
HEMATOCRIT: 40.4 % (ref 34.8–46.6)
HEMOGLOBIN: 13.7 g/dL (ref 11.6–15.9)
LYMPH#: 2.3 10*3/uL (ref 0.9–3.3)
LYMPH%: 34.7 % (ref 14.0–48.0)
MCH: 29 pg (ref 26.0–34.0)
MCHC: 33.9 g/dL (ref 32.0–36.0)
MCV: 85 fL (ref 81–101)
MONO#: 0.6 10*3/uL (ref 0.1–0.9)
MONO%: 9.4 % (ref 0.0–13.0)
NEUT%: 51.8 % (ref 39.6–80.0)
NEUTROS ABS: 3.4 10*3/uL (ref 1.5–6.5)
Platelets: 206 10*3/uL (ref 145–400)
RBC: 4.73 10*6/uL (ref 3.70–5.32)
RDW: 18.2 % — ABNORMAL HIGH (ref 11.1–15.7)
WBC: 6.6 10*3/uL (ref 3.9–10.0)

## 2016-07-06 LAB — FERRITIN: FERRITIN: 83 ng/mL (ref 9–269)

## 2016-07-06 LAB — IRON AND TIBC
%SAT: 31 % (ref 21–57)
Iron: 96 ug/dL (ref 41–142)
TIBC: 305 ug/dL (ref 236–444)
UIBC: 209 ug/dL (ref 120–384)

## 2016-07-06 LAB — CHCC SATELLITE - SMEAR

## 2016-07-06 NOTE — Progress Notes (Signed)
Hematology and Oncology Follow Up Visit  Meghan Santos [id number] 1935-04-11 81 y.o. 07/06/2016   Principle Diagnosis:  Stage IIB (T2 N1 M0) lobular carcinoma of the left breast.  Iron deficiency anemia  Current Therapy:    Status post IV iron- given 05/31/2016     Interim History:  Meghan Santos is back for her follow-up. We found that she did have iron deficiency anemia. Her ferritin was 10 with iron saturation of 16%. We last saw her, she was more anemic. She is microcytic. She does not want to test her blood stool for blood. I could not do a rectal exam on her. She did not note any melena or bright red blood per rectum.  We did go ahead and give her IV iron. She began to feel a lot better a couple weeks later. She feels much better. She has more energy. She has more stamina. She has not had any cough or shortness of breath. She's had no nausea or vomiting. His been no rashes. She's had no leg swelling. She's had no headache.  She definitely needs a colonoscopy. I will call her gastroenterologist for this.  She is getting ready for the holidays. She uses busy during the holidays.  She is eating well. She is not a vegetarian.  Medications:  Current Outpatient Prescriptions:  .  aspirin 81 MG tablet, Take 81 mg by mouth., Disp: , Rfl:  .  benazepril-hydrochlorthiazide (LOTENSIN HCT) 10-12.5 MG per tablet, daily. , Disp: , Rfl:  .  Cholecalciferol (VITAMIN D) 2000 UNITS tablet, Take 2,000 Units by mouth daily., Disp: , Rfl:  .  Coenzyme Q10 (CO Q-10) 100 MG CAPS, Take 200 mg by mouth every morning. , Disp: , Rfl:  .  CRESTOR 20 MG tablet, 20 mg daily. , Disp: , Rfl:  .  Cyanocobalamin (VITAMIN B 12 PO), Take by mouth every morning., Disp: , Rfl:  .  Ferrous Sulfate (IRON) 325 (65 Fe) MG TABS, TK 1 T PO QD, Disp: , Rfl: 6 .  furosemide (LASIX) 40 MG tablet, Take 2 tablets (80 mg total) by mouth daily., Disp: 180 tablet, Rfl: 2 .  levothyroxine (SYNTHROID, LEVOTHROID) 125 MCG tablet,  125 mcg daily. , Disp: , Rfl:  .  LORazepam (ATIVAN) 1 MG tablet, , Disp: , Rfl:  .  metformin (FORTAMET) 1000 MG (OSM) 24 hr tablet, Take 1,000 mg by mouth daily with breakfast. , Disp: , Rfl:  .  Multiple Vitamin (MULTI-VITAMIN DAILY PO), Take by mouth every morning., Disp: , Rfl:  .  potassium chloride SA (K-DUR,KLOR-CON) 20 MEQ tablet, TAKE 1 TABLET(20 MEQ) BY MOUTH DAILY, Disp: 90 tablet, Rfl: 2 .  sitaGLIPtin (JANUVIA) 100 MG tablet, Take 100 mg by mouth daily. Unknown dose , Disp: , Rfl:   Allergies: No Known Allergies  Past Medical History, Surgical history, Social history, and Family History were reviewed and updated.  Review of Systems: As above  Physical Exam:  height is 5' 5.5" (1.664 m) and weight is 182 lb 12.8 oz (82.9 kg). Her oral temperature is 98 F (36.7 C). Her blood pressure is 118/67 and her pulse is 77. Her respiration is 16.   Well-developed and well-nourished white female. Head and neck exam shows no ocular or oral lesions. Lungs are clear. Cardiac exam regular in rhythm with no murmurs rubs or bruits. Breast exam shows right breast with no masses, edema or erythema. There is no right axillary adenopathy. Left breast shows a well-healed lumpectomy the 5:00  position. There's some slight retraction of the left breast from radiation surgery. Some firmness is noted about the lumpectomy. No distinct masses noted. There is no right axillary adenopathy. Abdomen is soft. She's good bowel sounds. There is no palpable liver or spleen tip. Back exam shows no kyphosis. No tenderness is noted over the spine, ribs or hips. Extremities shows no clubbing, cyanosis or edema. Neurological exam shows no focal neurological deficits.  Lab Results  Component Value Date   WBC 6.6 07/06/2016   HGB 13.7 07/06/2016   HCT 40.4 07/06/2016   MCV 85 07/06/2016   PLT 206 07/06/2016     Chemistry      Component Value Date/Time   NA 137 05/18/2016 1417   K 4.3 05/18/2016 1417   CL 101  05/18/2016 1417   CO2 31 05/18/2016 1417   BUN 23 (H) 05/18/2016 1417   CREATININE 1.0 05/18/2016 1417      Component Value Date/Time   CALCIUM 10.1 05/18/2016 1417   ALKPHOS 55 05/18/2016 1417   AST 40 (H) 05/18/2016 1417   ALT 28 05/18/2016 1417   BILITOT 0.60 05/18/2016 1417         Impression and Plan: Meghan Santos is 81 year old white female with a history of stage IIb lobular carcinoma the left breast. She was diagnosed back in 1998. She underwent systemic chemotherapy followed by Arimidex. She had radiation therapy.  She completed Arimidex when 2008.  I do not see any evidence of recurrent disease.  She has responded very nicely to the IV iron. Her hemoglobin is back up to normal now. Her MCV is also a little better.  I still feel that she needs a colonoscopy and maybe even upper endoscopy. Even though she's not seen any blood, I have to believe that she does have blood loss through the GI tract. She just refuses to do any stool cards.   I will call her gastroenterologist, Dr. Cristina Gong, and see if he can do a colonoscopy with or without upper endoscopy.   We will plan to get her back in 6 months now.   [name], MD 10/19/201711:59 AM

## 2016-07-06 NOTE — Telephone Encounter (Addendum)
-----   Message from [name], MD sent at 07/06/2016  2:44 PM EDT ----- Call - iron level is  Much better!!  Pete  Pt notified of above message via phone. Expresses understanding and appreciation. dph

## 2016-07-07 LAB — RETICULOCYTES: Reticulocyte Count: 0.9 % (ref 0.6–2.6)

## 2016-07-10 ENCOUNTER — Other Ambulatory Visit: Payer: Self-pay | Admitting: Cardiovascular Disease

## 2016-07-10 NOTE — Telephone Encounter (Signed)
Rx request sent to pharmacy.  

## 2016-08-21 DIAGNOSIS — D509 Iron deficiency anemia, unspecified: Secondary | ICD-10-CM | POA: Diagnosis not present

## 2016-09-22 ENCOUNTER — Ambulatory Visit (INDEPENDENT_AMBULATORY_CARE_PROVIDER_SITE_OTHER): Payer: Medicare Other | Admitting: Cardiovascular Disease

## 2016-09-22 ENCOUNTER — Encounter: Payer: Self-pay | Admitting: Cardiovascular Disease

## 2016-09-22 VITALS — BP 110/78 | HR 62 | Ht 65.0 in | Wt 178.0 lb

## 2016-09-22 DIAGNOSIS — I10 Essential (primary) hypertension: Secondary | ICD-10-CM

## 2016-09-22 DIAGNOSIS — E7800 Pure hypercholesterolemia, unspecified: Secondary | ICD-10-CM | POA: Diagnosis not present

## 2016-09-22 DIAGNOSIS — I5032 Chronic diastolic (congestive) heart failure: Secondary | ICD-10-CM

## 2016-09-22 DIAGNOSIS — D500 Iron deficiency anemia secondary to blood loss (chronic): Secondary | ICD-10-CM | POA: Diagnosis not present

## 2016-09-22 NOTE — Patient Instructions (Signed)
Dr Sallyanne Kuster has recommended making the following medication changes: 1. INCREASE Co-Q-10 to 300 mg daily  Your physician recommends that you schedule a follow-up appointment in 1 year. You will receive a reminder letter in the mail two months in advance. If you don't receive a letter, please call our office to schedule the follow-up appointment.  If you need a refill on your cardiac medications before your next appointment, please call your pharmacy.

## 2016-09-22 NOTE — Progress Notes (Addendum)
Cardiology Office Note    Date:  09/22/2016   ID:  Meghan Santos, DOB 19-Mar-1935, MRN [id number]  PCP:  Jerlyn Ly, MD  Cardiologist:   Sanda Klein, MD   Chief Complaint  Patient presents with  . Follow-up    pt has no complaints '    History of Present Illness:  Meghan Santos is a 82 y.o. female with hypertension, mild aortic valve stenosis and well compensated diastolic heart failure as well as hyperlipidemia and chronic iron deficiency anemia.   She has generally done well from a cardiac point of view, without issues related to shortness of breath or edema. She has had problems with fatigue and muscle weakness. It is sometimes difficult to get in and out of a car and chest to stop to rest after climbing a flight of stairs. The symptoms became particularly bad after she switched from brand name Crestor to generic rosuvastatin. She is taking coenzyme every 10. The symptoms seem to be improving after switching back to brand name Crestor.  Previous echo shows borderline LVEF [id number], grade 2 diastolic dysfunction. Nuclear stress test has shown a very small region of apical ischemia. She has never undergone coronary angiography and she does not have angina pectoris. Note a remote history of chest radiation therapy and chemotherapy with Adriamycin for breast cancer in 1998.  She received an iron infusion for iron deficiency anemia in September, under Dr. Antonieta Pert supervision. Her hemoglobin increased to 13.7. She is still taking oral iron There appears to have been a plan for colonoscopy with Dr. Cristina Gong, but I don't think this was ever performed.  Past Medical History:  Diagnosis Date  . Breast cancer ([place]) 1998   left breast with radiation and chemo  . Diabetes ([place])   . Essential hypertension 09/05/2014  . Hypercholesteremia 09/05/2014  . Hypercholesterolemia   . Hypertension   . Mild obesity 09/05/2014    Past Surgical History:  Procedure Laterality Date  . BREAST  LUMPECTOMY  1998   left breast  . TONSILLECTOMY  1958  . VAGINAL HYSTERECTOMY  1983    Current Medications: Outpatient Medications Prior to Visit  Medication Sig Dispense Refill  . aspirin 81 MG tablet Take 81 mg by mouth.    . benazepril-hydrochlorthiazide (LOTENSIN HCT) 10-12.5 MG per tablet daily.     . Cholecalciferol (VITAMIN D) 2000 UNITS tablet Take 2,000 Units by mouth daily.    . Coenzyme Q10 (CO Q-10) 100 MG CAPS Take 200 mg by mouth every morning.     Marland Kitchen CRESTOR 20 MG tablet 20 mg daily.     . Cyanocobalamin (VITAMIN B 12 PO) Take by mouth every morning.    . Ferrous Sulfate (IRON) 325 (65 Fe) MG TABS TK 1 T PO QD  6  . furosemide (LASIX) 40 MG tablet TAKE 2 TABLETS BY MOUTH DAILY 180 tablet 1  . levothyroxine (SYNTHROID, LEVOTHROID) 125 MCG tablet 125 mcg daily.     Marland Kitchen LORazepam (ATIVAN) 1 MG tablet     . metformin (FORTAMET) 1000 MG (OSM) 24 hr tablet Take 1,000 mg by mouth daily with breakfast.     . Multiple Vitamin (MULTI-VITAMIN DAILY PO) Take by mouth every morning.    . potassium chloride SA (K-DUR,KLOR-CON) 20 MEQ tablet TAKE 1 TABLET(20 MEQ) BY MOUTH DAILY 90 tablet 2  . sitaGLIPtin (JANUVIA) 100 MG tablet Take 100 mg by mouth daily. Unknown dose      No facility-administered medications prior to visit.  Allergies:   Patient has no known allergies.   Social History   Social History  . Marital status: Married    Spouse name: N/A  . Number of children: N/A  . Years of education: N/A   Social History Main Topics  . Smoking status: Never Smoker  . Smokeless tobacco: Never Used     Comment: never used tobacco  . Alcohol use 0.0 oz/week     Comment: occas.  . Drug use: No  . Sexual activity: Not Asked   Other Topics Concern  . None   Social History Narrative  . None     Family History:  The patient's family history includes Alcoholism in her brother; Cancer in her sister; Dementia in her father; Diabetes in her brother; Heart attack in her mother.    ROS:   Please see the history of present illness.    ROS All other systems reviewed and are negative.   PHYSICAL EXAM:   VS:  BP 110/78 (BP Location: Right Arm, Patient Position: Sitting, Cuff Size: Normal)   Pulse 62   Ht 5\' 5"  (1.651 m)   Wt 178 lb (80.7 kg)   BMI 29.62 kg/m    GEN: Well nourished, well developed, in no acute distress . Fingernail beds appear maybe slightly pale HEENT: normal  Neck: no JVD, carotid bruits, or masses Cardiac: RRR; grade 2/6 early peaking systolic murmur in the aortic focus, no diastolic murmurs, rubs, or gallops,no edema  Respiratory:  clear to auscultation bilaterally, normal work of breathing GI: soft, nontender, nondistended, + BS MS: no deformity or atrophy  Skin: warm and dry, no rash Neuro:  Alert and Oriented x 3, Strength and sensation are intact Psych: euthymic mood, full affect  Wt Readings from Last 3 Encounters:  09/22/16 178 lb (80.7 kg)  07/06/16 182 lb 12.8 oz (82.9 kg)  05/18/16 179 lb (81.2 kg)      Studies/Labs Reviewed:   EKG:  EKG is ordered today.  The ekg ordered today demonstrates Normal sinus rhythm, left axis deviation and poor R-wave progression, normal QTC 454 milliseconds  Recent Labs: 07/06/2016: ALT 23; BUN 20.4; Creatinine 0.9; HGB 13.7; Platelets 206; Potassium 3.9; Sodium 141   Additional studies/ records that were reviewed today include:  Notes from Dr. Marin Olp    ASSESSMENT:    1. Chronic diastolic heart failure ([place])   2. Hypercholesteremia   3. Iron deficiency anemia due to chronic blood loss   4. Essential hypertension      PLAN:  In order of problems listed above:  1. CHF: Appears clinically euvolemic and well compensated. Her complaints are more of fatigue rather than breathlessness. Is probably time to recheck her hemoglobin and also reevaluate TSH. She is compliant with sodium restriction and her weight has been very steady. 2. HLP: Asked her to increase coenzyme Q 10-300 mg daily.  She probably has some distal LAD artery atherosclerotic disease, but does not have angina pectoris and prefers noninvasive evaluation and treatment. Ideally she should be on a statin, but I have told her that it is reasonable to try a 2-3 month "statin holiday" to see if her symptoms of fatigue improve. 3. Iron deficiency anemia: May have to re-visit the issue of a colonoscopy 4. HTN: well controlled.    Medication Adjustments/Labs and Tests Ordered: Current medicines are reviewed at length with the patient today.  Concerns regarding medicines are outlined above.  Medication changes, Labs and Tests ordered today are listed in the Patient  Instructions below. Patient Instructions  Dr Sallyanne Kuster has recommended making the following medication changes: 1. INCREASE Co-Q-10 to 300 mg daily  Your physician recommends that you schedule a follow-up appointment in 1 year. You will receive a reminder letter in the mail two months in advance. If you don't receive a letter, please call our office to schedule the follow-up appointment.  If you need a refill on your cardiac medications before your next appointment, please call your pharmacy.    Signed, Sanda Klein, MD  09/22/2016 5:05 PM    [facility] [place], [place],   [address] Phone: [phone]; Fax: [phone]

## 2016-10-16 DIAGNOSIS — D509 Iron deficiency anemia, unspecified: Secondary | ICD-10-CM | POA: Diagnosis not present

## 2016-10-16 DIAGNOSIS — D126 Benign neoplasm of colon, unspecified: Secondary | ICD-10-CM | POA: Diagnosis not present

## 2016-10-20 DIAGNOSIS — D126 Benign neoplasm of colon, unspecified: Secondary | ICD-10-CM | POA: Diagnosis not present

## 2016-10-24 ENCOUNTER — Encounter: Payer: Self-pay | Admitting: Hematology & Oncology

## 2016-10-24 DIAGNOSIS — E119 Type 2 diabetes mellitus without complications: Secondary | ICD-10-CM | POA: Diagnosis not present

## 2016-10-24 DIAGNOSIS — E784 Other hyperlipidemia: Secondary | ICD-10-CM | POA: Diagnosis not present

## 2016-10-24 DIAGNOSIS — D649 Anemia, unspecified: Secondary | ICD-10-CM | POA: Diagnosis not present

## 2016-10-24 DIAGNOSIS — I10 Essential (primary) hypertension: Secondary | ICD-10-CM | POA: Diagnosis not present

## 2016-10-30 ENCOUNTER — Other Ambulatory Visit: Payer: Self-pay | Admitting: Dermatology

## 2016-10-30 ENCOUNTER — Other Ambulatory Visit: Payer: Self-pay | Admitting: Hematology & Oncology

## 2016-10-30 DIAGNOSIS — C44311 Basal cell carcinoma of skin of nose: Secondary | ICD-10-CM | POA: Diagnosis not present

## 2016-10-30 DIAGNOSIS — Z1231 Encounter for screening mammogram for malignant neoplasm of breast: Secondary | ICD-10-CM

## 2016-10-31 ENCOUNTER — Ambulatory Visit
Admission: RE | Admit: 2016-10-31 | Discharge: 2016-10-31 | Disposition: A | Discharge status: Home / Self Care | Payer: Medicare Other | Source: Ambulatory Visit | Attending: Hematology & Oncology | Admitting: Hematology & Oncology

## 2016-10-31 DIAGNOSIS — Z1231 Encounter for screening mammogram for malignant neoplasm of breast: Secondary | ICD-10-CM | POA: Diagnosis not present

## 2016-11-23 DIAGNOSIS — D509 Iron deficiency anemia, unspecified: Secondary | ICD-10-CM | POA: Diagnosis not present

## 2016-12-11 DIAGNOSIS — L905 Scar conditions and fibrosis of skin: Secondary | ICD-10-CM | POA: Diagnosis not present

## 2016-12-11 DIAGNOSIS — Z85828 Personal history of other malignant neoplasm of skin: Secondary | ICD-10-CM | POA: Diagnosis not present

## 2016-12-29 ENCOUNTER — Other Ambulatory Visit: Payer: Self-pay | Admitting: Cardiovascular Disease

## 2016-12-29 NOTE — Telephone Encounter (Signed)
REFILL 

## 2017-01-04 ENCOUNTER — Ambulatory Visit (HOSPITAL_BASED_OUTPATIENT_CLINIC_OR_DEPARTMENT_OTHER): Payer: Medicare Other | Admitting: Hematology & Oncology

## 2017-01-04 ENCOUNTER — Other Ambulatory Visit (HOSPITAL_BASED_OUTPATIENT_CLINIC_OR_DEPARTMENT_OTHER): Payer: Medicare Other

## 2017-01-04 VITALS — BP 109/91 | HR 63 | Temp 97.7°F | Resp 18 | Wt 182.1 lb

## 2017-01-04 DIAGNOSIS — D500 Iron deficiency anemia secondary to blood loss (chronic): Secondary | ICD-10-CM | POA: Diagnosis not present

## 2017-01-04 DIAGNOSIS — Z853 Personal history of malignant neoplasm of breast: Secondary | ICD-10-CM | POA: Diagnosis not present

## 2017-01-04 LAB — CMP (CANCER CENTER ONLY)
ALBUMIN: 3.9 g/dL (ref 3.3–5.5)
ALK PHOS: 60 U/L (ref 26–84)
ALT: 40 U/L (ref 10–47)
AST: 45 U/L — ABNORMAL HIGH (ref 11–38)
BILIRUBIN TOTAL: 0.7 mg/dL (ref 0.20–1.60)
BUN, Bld: 12 mg/dL (ref 7–22)
CALCIUM: 9.6 mg/dL (ref 8.0–10.3)
CO2: 32 mEq/L (ref 18–33)
Chloride: 98 mEq/L (ref 98–108)
Creat: 0.8 mg/dl (ref 0.6–1.2)
GLUCOSE: 119 mg/dL — AB (ref 73–118)
Potassium: 3.8 mEq/L (ref 3.3–4.7)
Sodium: 140 mEq/L (ref 128–145)
TOTAL PROTEIN: 7.6 g/dL (ref 6.4–8.1)

## 2017-01-04 LAB — CBC WITH DIFFERENTIAL (CANCER CENTER ONLY)
BASO#: 0 10*3/uL (ref 0.0–0.2)
BASO%: 0.5 % (ref 0.0–2.0)
EOS ABS: 0.2 10*3/uL (ref 0.0–0.5)
EOS%: 2.7 % (ref 0.0–7.0)
HEMATOCRIT: 43.1 % (ref 34.8–46.6)
HGB: 14.7 g/dL (ref 11.6–15.9)
LYMPH#: 2.6 10*3/uL (ref 0.9–3.3)
LYMPH%: 40.9 % (ref 14.0–48.0)
MCH: 31.7 pg (ref 26.0–34.0)
MCHC: 34.1 g/dL (ref 32.0–36.0)
MCV: 93 fL (ref 81–101)
MONO#: 0.5 10*3/uL (ref 0.1–0.9)
MONO%: 7.7 % (ref 0.0–13.0)
NEUT#: 3.1 10*3/uL (ref 1.5–6.5)
NEUT%: 48.2 % (ref 39.6–80.0)
PLATELETS: 247 10*3/uL (ref 145–400)
RBC: 4.64 10*6/uL (ref 3.70–5.32)
RDW: 12.2 % (ref 11.1–15.7)
WBC: 6.4 10*3/uL (ref 3.9–10.0)

## 2017-01-04 LAB — IRON AND TIBC
%SAT: 43 % (ref 21–57)
Iron: 159 ug/dL — ABNORMAL HIGH (ref 41–142)
TIBC: 366 ug/dL (ref 236–444)
UIBC: 207 ug/dL (ref 120–384)

## 2017-01-04 LAB — FERRITIN: Ferritin: 49 ng/mL (ref 9–269)

## 2017-01-04 NOTE — Progress Notes (Signed)
Hematology and Oncology Follow Up Visit  Ozelle Brubacher Livers [id number] Aug 30, 1935 82 y.o. 01/04/2017   Principle Diagnosis:  Stage IIB (T2 N1 M0) lobular carcinoma of the left breast.  Iron deficiency anemia  Current Therapy:    Status post IV iron- given 05/31/2016     Interim History:  Ms.  Kofoed is back for her follow-up. She looks great. She had no problems since we last saw her. She had her mammogram done back in February. Everything was okay on the mammogram.. If she got iron last year. This really made a difference in her. She has tolerated the iron very well. She feels better. Her hemoglobin has improved nicely. Her iron studies back in October showed a ferritin of 83 with iron saturation of 31%. I would expect that these numbers should continue to improve.   She has had no problems with fever. She's had no influenza.  She and her husband do not travel all that much anymore. I think that he has some visual problems. She has some arthritic issues.   She is eating well. She is not a vegetarian.  Overall, her performance status is ECOG 0.  Medications:  Current Outpatient Prescriptions:  .  aspirin 81 MG tablet, Take 81 mg by mouth., Disp: , Rfl:  .  benazepril-hydrochlorthiazide (LOTENSIN HCT) 10-12.5 MG per tablet, daily. , Disp: , Rfl:  .  Biotin 5000 MCG TABS, Take by mouth., Disp: , Rfl:  .  Cholecalciferol (VITAMIN D) 2000 UNITS tablet, Take 2,000 Units by mouth daily., Disp: , Rfl:  .  Coenzyme Q10 (CO Q-10) 100 MG CAPS, Take 300 mg by mouth every morning. , Disp: , Rfl:  .  CRESTOR 20 MG tablet, 20 mg daily. , Disp: , Rfl:  .  Cyanocobalamin (VITAMIN B 12 PO), Take by mouth every morning., Disp: , Rfl:  .  Ferrous Sulfate (IRON) 325 (65 Fe) MG TABS, TK 1 T PO QD, Disp: , Rfl: 6 .  furosemide (LASIX) 40 MG tablet, TAKE 2 TABLETS BY MOUTH DAILY, Disp: 180 tablet, Rfl: 1 .  levothyroxine (SYNTHROID, LEVOTHROID) 125 MCG tablet, 125 mcg daily. , Disp: , Rfl:  .  LORazepam  (ATIVAN) 1 MG tablet, , Disp: , Rfl:  .  metformin (FORTAMET) 1000 MG (OSM) 24 hr tablet, Take 1,000 mg by mouth daily with breakfast. , Disp: , Rfl:  .  Multiple Vitamin (MULTI-VITAMIN DAILY PO), Take by mouth every morning., Disp: , Rfl:  .  potassium chloride SA (K-DUR,KLOR-CON) 20 MEQ tablet, TAKE 1 TABLET(20 MEQ) BY MOUTH DAILY, Disp: 90 tablet, Rfl: 3 .  sitaGLIPtin (JANUVIA) 100 MG tablet, Take 100 mg by mouth daily. Unknown dose , Disp: , Rfl:   Allergies: No Known Allergies  Past Medical History, Surgical history, Social history, and Family History were reviewed and updated.  Review of Systems: As above  Physical Exam:  weight is 182 lb 1.9 oz (82.6 kg). Her oral temperature is 97.7 F (36.5 C). Her blood pressure is 109/91 (abnormal) and her pulse is 63. Her respiration is 18 and oxygen saturation is 100%.   Well-developed and well-nourished white female. Head and neck exam shows no ocular or oral lesions. Lungs are clear. Cardiac exam regular in rhythm with no murmurs rubs or bruits. Breast exam shows right breast with no masses, edema or erythema. There is no right axillary adenopathy. Left breast shows a well-healed lumpectomy the 5:00 position. There's some slight retraction of the left breast from radiation surgery. Some firmness  is noted about the lumpectomy. No distinct masses noted. There is no right axillary adenopathy. Abdomen is soft. She's good bowel sounds. There is no palpable liver or spleen tip. Back exam shows no kyphosis. No tenderness is noted over the spine, ribs or hips. Extremities shows no clubbing, cyanosis or edema. Neurological exam shows no focal neurological deficits.  Lab Results  Component Value Date   WBC 6.4 01/04/2017   HGB 14.7 01/04/2017   HCT 43.1 01/04/2017   MCV 93 01/04/2017   PLT 247 01/04/2017     Chemistry      Component Value Date/Time   NA 140 01/04/2017 1012   NA 141 07/06/2016 1113   K 3.8 01/04/2017 1012   K 3.9 07/06/2016 1113    CL 98 01/04/2017 1012   CO2 32 01/04/2017 1012   CO2 29 07/06/2016 1113   BUN 12 01/04/2017 1012   BUN 20.4 07/06/2016 1113   CREATININE 0.8 01/04/2017 1012   CREATININE 0.9 07/06/2016 1113      Component Value Date/Time   CALCIUM 9.6 01/04/2017 1012   CALCIUM 10.9 (H) 07/06/2016 1113   ALKPHOS 60 01/04/2017 1012   ALKPHOS 73 07/06/2016 1113   AST 45 (H) 01/04/2017 1012   AST 31 07/06/2016 1113   ALT 40 01/04/2017 1012   ALT 23 07/06/2016 1113   BILITOT 0.70 01/04/2017 1012   BILITOT 0.35 07/06/2016 1113         Impression and Plan: Ms. Mccaig is 82 year old white female with a history of stage IIb lobular carcinoma the left breast. She was diagnosed back in 1998. She underwent systemic chemotherapy followed by Arimidex. She had radiation therapy.  She completed Arimidex when 2008.  I do not see any evidence of recurrent disease.  She has responded very nicely to the IV iron. Her hemoglobin is back up to normal now. Her MCV is also a little better.  II'm glad that her colonoscopy and upper endoscopy were okay.  Her last mammogram was back in February. Everything looked fine at that time.  We will plan to get her back in 6 months now. She really likes come back to see [place]. She just feels much more confident in all R evaluation of her.   [name], MD 4/19/201811:56 AM

## 2017-01-05 ENCOUNTER — Telehealth: Payer: Self-pay | Admitting: *Deleted

## 2017-01-05 NOTE — Telephone Encounter (Addendum)
Patient is aware of results  ----- Message from [name], MD sent at 01/04/2017  3:41 PM EDT ----- Call - iron level is much better!!!  pete

## 2017-02-02 ENCOUNTER — Other Ambulatory Visit: Payer: Self-pay | Admitting: Cardiovascular Disease

## 2017-02-16 DIAGNOSIS — H5213 Myopia, bilateral: Secondary | ICD-10-CM | POA: Diagnosis not present

## 2017-03-12 DIAGNOSIS — L708 Other acne: Secondary | ICD-10-CM | POA: Diagnosis not present

## 2017-03-12 DIAGNOSIS — L570 Actinic keratosis: Secondary | ICD-10-CM | POA: Diagnosis not present

## 2017-03-12 DIAGNOSIS — Z85828 Personal history of other malignant neoplasm of skin: Secondary | ICD-10-CM | POA: Diagnosis not present

## 2017-05-31 DIAGNOSIS — M859 Disorder of bone density and structure, unspecified: Secondary | ICD-10-CM | POA: Diagnosis not present

## 2017-05-31 DIAGNOSIS — E784 Other hyperlipidemia: Secondary | ICD-10-CM | POA: Diagnosis not present

## 2017-05-31 DIAGNOSIS — E038 Other specified hypothyroidism: Secondary | ICD-10-CM | POA: Diagnosis not present

## 2017-05-31 DIAGNOSIS — N390 Urinary tract infection, site not specified: Secondary | ICD-10-CM | POA: Diagnosis not present

## 2017-05-31 DIAGNOSIS — I10 Essential (primary) hypertension: Secondary | ICD-10-CM | POA: Diagnosis not present

## 2017-05-31 DIAGNOSIS — E538 Deficiency of other specified B group vitamins: Secondary | ICD-10-CM | POA: Diagnosis not present

## 2017-06-08 DIAGNOSIS — I350 Nonrheumatic aortic (valve) stenosis: Secondary | ICD-10-CM | POA: Diagnosis not present

## 2017-06-08 DIAGNOSIS — Z23 Encounter for immunization: Secondary | ICD-10-CM | POA: Diagnosis not present

## 2017-06-08 DIAGNOSIS — R3121 Asymptomatic microscopic hematuria: Secondary | ICD-10-CM | POA: Diagnosis not present

## 2017-06-08 DIAGNOSIS — Z0000 Encounter for general adult medical examination without abnormal findings: Secondary | ICD-10-CM | POA: Diagnosis not present

## 2017-06-08 DIAGNOSIS — C50919 Malignant neoplasm of unspecified site of unspecified female breast: Secondary | ICD-10-CM | POA: Diagnosis not present

## 2017-07-06 ENCOUNTER — Other Ambulatory Visit (HOSPITAL_BASED_OUTPATIENT_CLINIC_OR_DEPARTMENT_OTHER): Payer: Medicare Other

## 2017-07-06 ENCOUNTER — Ambulatory Visit (HOSPITAL_BASED_OUTPATIENT_CLINIC_OR_DEPARTMENT_OTHER): Payer: Medicare Other | Admitting: Hematology & Oncology

## 2017-07-06 VITALS — BP 111/62 | HR 72 | Temp 97.9°F | Resp 18 | Wt 179.0 lb

## 2017-07-06 DIAGNOSIS — D500 Iron deficiency anemia secondary to blood loss (chronic): Secondary | ICD-10-CM

## 2017-07-06 DIAGNOSIS — M818 Other osteoporosis without current pathological fracture: Secondary | ICD-10-CM | POA: Diagnosis not present

## 2017-07-06 DIAGNOSIS — T386X5A Adverse effect of antigonadotrophins, antiestrogens, antiandrogens, not elsewhere classified, initial encounter: Secondary | ICD-10-CM

## 2017-07-06 DIAGNOSIS — Z853 Personal history of malignant neoplasm of breast: Secondary | ICD-10-CM | POA: Diagnosis not present

## 2017-07-06 LAB — CMP (CANCER CENTER ONLY)
ALBUMIN: 3.6 g/dL (ref 3.3–5.5)
ALT(SGPT): 35 U/L (ref 10–47)
AST: 42 U/L — ABNORMAL HIGH (ref 11–38)
Alkaline Phosphatase: 48 U/L (ref 26–84)
BUN, Bld: 11 mg/dL (ref 7–22)
CHLORIDE: 100 meq/L (ref 98–108)
CO2: 32 mEq/L (ref 18–33)
CREATININE: 1 mg/dL (ref 0.6–1.2)
Calcium: 10 mg/dL (ref 8.0–10.3)
Glucose, Bld: 124 mg/dL — ABNORMAL HIGH (ref 73–118)
POTASSIUM: 4.1 meq/L (ref 3.3–4.7)
SODIUM: 143 meq/L (ref 128–145)
TOTAL PROTEIN: 7.6 g/dL (ref 6.4–8.1)
Total Bilirubin: 0.8 mg/dl (ref 0.20–1.60)

## 2017-07-06 LAB — CBC WITH DIFFERENTIAL (CANCER CENTER ONLY)
BASO#: 0 10*3/uL (ref 0.0–0.2)
BASO%: 0.4 % (ref 0.0–2.0)
EOS%: 2.3 % (ref 0.0–7.0)
Eosinophils Absolute: 0.1 10*3/uL (ref 0.0–0.5)
HCT: 40.7 % (ref 34.8–46.6)
HEMOGLOBIN: 13.9 g/dL (ref 11.6–15.9)
LYMPH#: 2.1 10*3/uL (ref 0.9–3.3)
LYMPH%: 40.4 % (ref 14.0–48.0)
MCH: 32 pg (ref 26.0–34.0)
MCHC: 34.2 g/dL (ref 32.0–36.0)
MCV: 94 fL (ref 81–101)
MONO#: 0.5 10*3/uL (ref 0.1–0.9)
MONO%: 9.1 % (ref 0.0–13.0)
NEUT#: 2.5 10*3/uL (ref 1.5–6.5)
NEUT%: 47.8 % (ref 39.6–80.0)
Platelets: 218 10*3/uL (ref 145–400)
RBC: 4.35 10*6/uL (ref 3.70–5.32)
RDW: 12.3 % (ref 11.1–15.7)
WBC: 5.3 10*3/uL (ref 3.9–10.0)

## 2017-07-06 LAB — IRON AND TIBC
%SAT: 38 % (ref 21–57)
Iron: 130 ug/dL (ref 41–142)
TIBC: 340 ug/dL (ref 236–444)
UIBC: 210 ug/dL (ref 120–384)

## 2017-07-06 LAB — FERRITIN: Ferritin: 52 ng/ml (ref 9–269)
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
## 2017-07-09 ENCOUNTER — Telehealth: Payer: Self-pay

## 2017-07-09 NOTE — Telephone Encounter (Addendum)
-----   Message from [name], MD sent at 07/06/2017  5:22 PM EDT ----- Call - iron level is ok!!  pete  Attempted to contact patient with results. No answer. No opportunity to leave message. dph

## 2017-07-12 ENCOUNTER — Ambulatory Visit (HOSPITAL_BASED_OUTPATIENT_CLINIC_OR_DEPARTMENT_OTHER): Payer: Medicare Other

## 2017-07-12 VITALS — BP 109/64 | HR 67 | Temp 97.6°F | Resp 16

## 2017-07-12 DIAGNOSIS — T386X5A Adverse effect of antigonadotrophins, antiestrogens, antiandrogens, not elsewhere classified, initial encounter: Principal | ICD-10-CM

## 2017-07-12 DIAGNOSIS — M818 Other osteoporosis without current pathological fracture: Secondary | ICD-10-CM

## 2017-07-12 MED ORDER — DENOSUMAB 60 MG/ML ~~LOC~~ SOLN
60.0000 mg | Freq: Once | SUBCUTANEOUS | Status: AC
  Administered 2017-07-12: 60 mg via SUBCUTANEOUS
  Filled 2017-07-12: qty 1

## 2017-07-12 NOTE — Patient Instructions (Signed)

## 2017-07-24 ENCOUNTER — Ambulatory Visit: Payer: Medicare Other

## 2017-09-24 DIAGNOSIS — E119 Type 2 diabetes mellitus without complications: Secondary | ICD-10-CM | POA: Diagnosis not present

## 2017-09-25 ENCOUNTER — Encounter: Payer: Self-pay | Admitting: Cardiovascular Disease

## 2017-09-25 ENCOUNTER — Ambulatory Visit: Payer: Medicare Other | Admitting: Cardiovascular Disease

## 2017-09-25 VITALS — BP 106/72 | HR 66 | Ht 66.5 in | Wt 181.0 lb

## 2017-09-25 DIAGNOSIS — E7800 Pure hypercholesterolemia, unspecified: Secondary | ICD-10-CM

## 2017-09-25 DIAGNOSIS — I5032 Chronic diastolic (congestive) heart failure: Secondary | ICD-10-CM

## 2017-09-25 DIAGNOSIS — D500 Iron deficiency anemia secondary to blood loss (chronic): Secondary | ICD-10-CM | POA: Diagnosis not present

## 2017-09-25 DIAGNOSIS — I350 Nonrheumatic aortic (valve) stenosis: Secondary | ICD-10-CM

## 2017-09-25 DIAGNOSIS — I10 Essential (primary) hypertension: Secondary | ICD-10-CM

## 2017-09-25 DIAGNOSIS — R931 Abnormal findings on diagnostic imaging of heart and coronary circulation: Secondary | ICD-10-CM | POA: Diagnosis not present

## 2017-09-25 NOTE — Progress Notes (Signed)
Cardiology Office Note    Date:  09/25/2017   ID:  Meghan Santos, DOB 26-Apr-1935, MRN [id number]  PCP:  Crist Infante, MD  Cardiologist:   Sanda Klein, MD   Chief Complaint  Patient presents with  . Follow-up    12 months  . Chest Pain    History of Present Illness:  Meghan Santos is a 83 y.o. female with hypertension, mild aortic valve stenosis and well compensated diastolic heart failure as well as hyperlipidemia and chronic iron deficiency anemia.   She has been well since her last appointment. She is rather sedentary during the winter months, but over the summer she worked in the yard without problems with shortness of breath. Her fatigue has improved dramatically since her anemia was resolved. She still has some occasional problems getting up and down stairs, but otherwise her muscle weakness is not an impediment. She does not have problems with exertional dyspnea or angina, palpitations, dizziness, syncope, leg edema, claudication or other cardiovascular complaints.  Previous echo shows borderline LVEF 76-16%, grade 2 diastolic dysfunction. Nuclear stress test has shown a very small region of apical ischemia. She has never undergone coronary angiography and she does not have angina pectoris. Note a remote history of chest radiation therapy and chemotherapy with Adriamycin for breast cancer in 1998.  Past Medical History:  Diagnosis Date  . Breast cancer ([place]) 1998   left breast with radiation and chemo  . Diabetes ([place])   . Essential hypertension 09/05/2014  . Hypercholesteremia 09/05/2014  . Hypercholesterolemia   . Hypertension   . Mild obesity 09/05/2014    Past Surgical History:  Procedure Laterality Date  . BREAST LUMPECTOMY  1998   left breast  . BREAST LUMPECTOMY Left 11/1996  . TONSILLECTOMY  1958  . VAGINAL HYSTERECTOMY  1983    Current Medications: Outpatient Medications Prior to Visit  Medication Sig Dispense Refill  . aspirin 81 MG tablet Take 81 mg  by mouth.    . benazepril-hydrochlorthiazide (LOTENSIN HCT) 10-12.5 MG per tablet daily.     . Biotin 5000 MCG TABS Take by mouth.    . Calcium Carbonate (CALCIUM 600 PO) Take 1 tablet by mouth daily.    . Cholecalciferol (VITAMIN D) 2000 UNITS tablet Take 2,000 Units by mouth daily.    . Coenzyme Q10 (CO Q-10) 100 MG CAPS Take 300 mg by mouth every morning.     Marland Kitchen CRESTOR 20 MG tablet 20 mg daily.     . Cyanocobalamin (VITAMIN B 12 PO) Take by mouth every morning.    . Ferrous Sulfate (IRON) 325 (65 Fe) MG TABS TK 1 T PO QD  6  . furosemide (LASIX) 40 MG tablet TAKE 2 TABLETS BY MOUTH DAILY 180 tablet 2  . levothyroxine (SYNTHROID, LEVOTHROID) 125 MCG tablet 125 mcg daily.     Marland Kitchen LORazepam (ATIVAN) 1 MG tablet     . metFORMIN (GLUCOPHAGE) 1000 MG tablet TK 1 T PO D AT SUPPER TIME  0  . Multiple Vitamin (MULTI-VITAMIN DAILY PO) Take by mouth every morning.    . potassium chloride SA (K-DUR,KLOR-CON) 20 MEQ tablet TAKE 1 TABLET(20 MEQ) BY MOUTH DAILY 90 tablet 3  . sitaGLIPtin (JANUVIA) 100 MG tablet Take 100 mg by mouth daily. Unknown dose      No facility-administered medications prior to visit.      Allergies:   Patient has no known allergies.   Social History   Socioeconomic History  . Marital status: Married  Spouse name: None  . Number of children: None  . Years of education: None  . Highest education level: None  Social Needs  . Financial resource strain: None  . Food insecurity - worry: None  . Food insecurity - inability: None  . Transportation needs - medical: None  . Transportation needs - non-medical: None  Occupational History  . None  Tobacco Use  . Smoking status: Never Smoker  . Smokeless tobacco: Never Used  . Tobacco comment: never used tobacco  Substance and Sexual Activity  . Alcohol use: Yes    Alcohol/week: 0.0 oz    Comment: occas.  . Drug use: No  . Sexual activity: None  Other Topics Concern  . None  Social History Narrative  . None      Family History:  The patient's family history includes Alcoholism in her brother; Cancer in her sister; Dementia in her father; Diabetes in her brother; Heart attack in her mother.   ROS:   Please see the history of present illness.    ROS All other systems reviewed and are negative.   PHYSICAL EXAM:   VS:  BP 106/72   Pulse 66   Ht 5' 6.5" (1.689 m)   Wt 181 lb (82.1 kg)   BMI 28.78 kg/m     General: Alert, oriented x3, no distress, she is not pale. Mild thoracic kyphoscoliosis Head: no evidence of trauma, PERRL, EOMI, no exophtalmos or lid lag, no myxedema, no xanthelasma; normal ears, nose and oropharynx Neck: normal jugular venous pulsations and no hepatojugular reflux; brisk carotid pulses without delay and no carotid bruits Chest: clear to auscultation, no signs of consolidation by percussion or palpation, normal fremitus, symmetrical and full respiratory excursions Cardiovascular: normal position and quality of the apical impulse, regular rhythm, normal first and second heart sounds, early peaking 1/6 systolic ejection murmur in the aortic focus, no diastolic murmurs, rubs or gallops Abdomen: no tenderness or distention, no masses by palpation, no abnormal pulsatility or arterial bruits, normal bowel sounds, no hepatosplenomegaly Extremities: no clubbing, cyanosis or edema; 2+ radial, ulnar and brachial pulses bilaterally; 2+ right femoral, posterior tibial and dorsalis pedis pulses; 2+ left femoral, posterior tibial and dorsalis pedis pulses; no subclavian or femoral bruits Neurological: grossly nonfocal Psych: Normal mood and affect   Wt Readings from Last 3 Encounters:  09/25/17 181 lb (82.1 kg)  07/06/17 179 lb (81.2 kg)  01/04/17 182 lb 1.9 oz (82.6 kg)      Studies/Labs Reviewed:   EKG:  EKG is ordered today.  The ekg ordered today demonstrates normal sinus rhythm, borderline left axis deviation and poor R wave progression not meeting criteria for left anterior  fascicular block, otherwise normal tracing, QTC 444 ms  Recent Labs: 07/06/2017: ALT(SGPT) 35; BUN, Bld 11; Creat 1.0; HGB 13.9; Platelets 218; Potassium 4.1; Sodium 143   Additional studies/ records that were reviewed today include:  Notes from Dr. Marin Olp October 2018    ASSESSMENT:    1. Chronic diastolic heart failure ([place])   2. Nonrheumatic aortic valve stenosis   3. Abnormal nuclear cardiac imaging test   4. Hypercholesteremia   5. Iron deficiency anemia due to chronic blood loss   6. Essential hypertension      PLAN:  In order of problems listed above:  1. CHF: Once her anemia was resolved, she has really not had problems with symptoms of heart failure. Appears clinically euvolemic by exam. 2. AS: Her murmur is much less prominent today. I  think her murmur was loud because she had anemia. Very mild areas by previous echo. Not likely to be hemodynamically important for a long time to come. 3. Abn nuclear test: she does not have angina pectoris and has preserved LV function. No indication for angiography at this time. 4. HLP: Tolerating rosuvastatin without side effects at this point 5. Iron deficiency anemia: Compensated on oral iron supplement 6. HTN: well controlled.   Medication Adjustments/Labs and Tests Ordered: Current medicines are reviewed at length with the patient today.  Concerns regarding medicines are outlined above.  Medication changes, Labs and Tests ordered today are listed in the Patient Instructions below. Patient Instructions  Dr Sallyanne Kuster recommends that you schedule a follow-up appointment in 12 months. You will receive a reminder letter in the mail two months in advance. If you don't receive a letter, please call our office to schedule the follow-up appointment.  If you need a refill on your cardiac medications before your next appointment, please call your pharmacy.    Signed, Sanda Klein, MD  09/25/2017 12:50 PM    [address], [place], [place]  [address] Phone: [phone]; Fax: [phone]

## 2017-09-25 NOTE — Patient Instructions (Signed)
Dr Croitoru recommends that you schedule a follow-up appointment in 12 months. You will receive a reminder letter in the mail two months in advance. If you don't receive a letter, please call our office to schedule the follow-up appointment.  If you need a refill on your cardiac medications before your next appointment, please call your pharmacy. 

## 2017-10-29 ENCOUNTER — Other Ambulatory Visit: Payer: Self-pay | Admitting: Cardiovascular Disease

## 2017-10-29 NOTE — Telephone Encounter (Signed)
REFILL 

## 2017-11-16 DIAGNOSIS — L905 Scar conditions and fibrosis of skin: Secondary | ICD-10-CM | POA: Diagnosis not present

## 2017-11-16 DIAGNOSIS — Z85828 Personal history of other malignant neoplasm of skin: Secondary | ICD-10-CM | POA: Diagnosis not present

## 2017-11-16 DIAGNOSIS — L570 Actinic keratosis: Secondary | ICD-10-CM | POA: Diagnosis not present

## 2017-11-16 DIAGNOSIS — C44311 Basal cell carcinoma of skin of nose: Secondary | ICD-10-CM | POA: Diagnosis not present

## 2017-11-16 DIAGNOSIS — D485 Neoplasm of uncertain behavior of skin: Secondary | ICD-10-CM | POA: Diagnosis not present

## 2017-11-28 DIAGNOSIS — C44311 Basal cell carcinoma of skin of nose: Secondary | ICD-10-CM | POA: Diagnosis not present

## 2017-11-28 DIAGNOSIS — Z85828 Personal history of other malignant neoplasm of skin: Secondary | ICD-10-CM | POA: Diagnosis not present

## 2017-12-04 DIAGNOSIS — I5189 Other ill-defined heart diseases: Secondary | ICD-10-CM | POA: Diagnosis not present

## 2017-12-04 DIAGNOSIS — E119 Type 2 diabetes mellitus without complications: Secondary | ICD-10-CM | POA: Diagnosis not present

## 2017-12-04 DIAGNOSIS — C50919 Malignant neoplasm of unspecified site of unspecified female breast: Secondary | ICD-10-CM | POA: Diagnosis not present

## 2017-12-04 DIAGNOSIS — I350 Nonrheumatic aortic (valve) stenosis: Secondary | ICD-10-CM | POA: Diagnosis not present

## 2017-12-12 ENCOUNTER — Other Ambulatory Visit: Payer: Self-pay | Admitting: Hematology & Oncology

## 2017-12-12 DIAGNOSIS — Z1231 Encounter for screening mammogram for malignant neoplasm of breast: Secondary | ICD-10-CM

## 2017-12-13 ENCOUNTER — Ambulatory Visit (INDEPENDENT_AMBULATORY_CARE_PROVIDER_SITE_OTHER): Payer: Medicare Other

## 2017-12-13 ENCOUNTER — Encounter: Payer: Self-pay | Admitting: Podiatry

## 2017-12-13 ENCOUNTER — Ambulatory Visit (INDEPENDENT_AMBULATORY_CARE_PROVIDER_SITE_OTHER): Payer: Medicare Other | Admitting: Podiatry

## 2017-12-13 ENCOUNTER — Other Ambulatory Visit: Payer: Self-pay | Admitting: Podiatry

## 2017-12-13 DIAGNOSIS — L84 Corns and callosities: Secondary | ICD-10-CM | POA: Diagnosis not present

## 2017-12-13 DIAGNOSIS — M2040 Other hammer toe(s) (acquired), unspecified foot: Secondary | ICD-10-CM | POA: Diagnosis not present

## 2017-12-13 DIAGNOSIS — M779 Enthesopathy, unspecified: Secondary | ICD-10-CM

## 2017-12-13 MED ORDER — TRIAMCINOLONE ACETONIDE 10 MG/ML IJ SUSP
10.0000 mg | Freq: Once | INTRAMUSCULAR | Status: AC
  Administered 2017-12-13: 10 mg

## 2017-12-13 NOTE — Progress Notes (Signed)
Subjective:   Patient ID: Meghan Santos, female   DOB: 83 y.o.   MRN: [id number]   HPI Patient presents stating she is getting a lot of pain between the fourth and fifth toes of the right foot and she has a lesion under the right foot that has been painful and making it hard to walk or wear shoe gear comfortably.  Patient states been going on for a number of months and she does not remember injury   ROS      Objective:  Physical Exam  Neurovascular status is unchanged from previous visit with inflammation of the fourth interspace right with rotated fifth digit and small keratotic lesion between the fifth and fourth toe right in the more proximal section.  Also has significant keratotic lesion sub-fifth metatarsal right     Assessment:  Inflammatory capsulitis fourth interspace right with rotated fifth digit creating keratotic lesion and pain and plantar keratotic lesion right     Plan:  H&P x-ray reviewed and injected the fourth interspace right 3 mg Dexasone Kenalog 5 mg Xylocaine and discussed possible digital correction of a partial syndactylization procedure with patient.  Today I debrided the plantar lesion right with sharp sterile instrumentation with no iatrogenic bleeding noted  X-ray indicates rotation of the toe but no other pathology

## 2017-12-18 DIAGNOSIS — C44311 Basal cell carcinoma of skin of nose: Secondary | ICD-10-CM | POA: Diagnosis not present

## 2017-12-18 DIAGNOSIS — Z85828 Personal history of other malignant neoplasm of skin: Secondary | ICD-10-CM | POA: Diagnosis not present

## 2017-12-24 ENCOUNTER — Other Ambulatory Visit: Payer: Self-pay | Admitting: Cardiovascular Disease

## 2018-01-02 ENCOUNTER — Ambulatory Visit: Payer: Medicare Other

## 2018-01-03 ENCOUNTER — Other Ambulatory Visit: Payer: Self-pay

## 2018-01-03 ENCOUNTER — Inpatient Hospital Stay: Payer: Medicare Other | Attending: Hematology & Oncology | Admitting: Hematology & Oncology

## 2018-01-03 ENCOUNTER — Inpatient Hospital Stay: Payer: Medicare Other

## 2018-01-03 VITALS — BP 126/54 | HR 71 | Temp 97.7°F | Resp 17 | Wt 181.0 lb

## 2018-01-03 DIAGNOSIS — D509 Iron deficiency anemia, unspecified: Secondary | ICD-10-CM | POA: Insufficient documentation

## 2018-01-03 DIAGNOSIS — D500 Iron deficiency anemia secondary to blood loss (chronic): Secondary | ICD-10-CM

## 2018-01-03 DIAGNOSIS — M810 Age-related osteoporosis without current pathological fracture: Secondary | ICD-10-CM | POA: Diagnosis present

## 2018-01-03 DIAGNOSIS — Z853 Personal history of malignant neoplasm of breast: Secondary | ICD-10-CM | POA: Insufficient documentation

## 2018-01-03 DIAGNOSIS — M8580 Other specified disorders of bone density and structure, unspecified site: Secondary | ICD-10-CM

## 2018-01-03 DIAGNOSIS — M818 Other osteoporosis without current pathological fracture: Secondary | ICD-10-CM

## 2018-01-03 DIAGNOSIS — T386X5A Adverse effect of antigonadotrophins, antiestrogens, antiandrogens, not elsewhere classified, initial encounter: Secondary | ICD-10-CM

## 2018-01-03 LAB — CMP (CANCER CENTER ONLY)
ALBUMIN: 4.2 g/dL (ref 3.5–5.0)
ALK PHOS: 49 U/L (ref 40–150)
ALT: 32 U/L (ref 0–55)
ANION GAP: 8 (ref 3–11)
AST: 38 U/L — ABNORMAL HIGH (ref 5–34)
BILIRUBIN TOTAL: 0.4 mg/dL (ref 0.2–1.2)
BUN: 19 mg/dL (ref 7–26)
CALCIUM: 10.5 mg/dL — AB (ref 8.4–10.4)
CO2: 33 mmol/L — ABNORMAL HIGH (ref 22–29)
Chloride: 100 mmol/L (ref 98–109)
Creatinine: 0.97 mg/dL (ref 0.60–1.10)
GFR, EST NON AFRICAN AMERICAN: 53 mL/min — AB (ref >60)
GFR, Est AFR Am: >60 mL/min (ref >60)
GLUCOSE: 94 mg/dL (ref 70–140)
Potassium: 3.9 mmol/L (ref 3.5–5.1)
Sodium: 141 mmol/L (ref 136–145)
TOTAL PROTEIN: 7.9 g/dL (ref 6.4–8.3)

## 2018-01-03 LAB — IRON AND TIBC
Iron: 94 ug/dL (ref 41–142)
Saturation Ratios: 29 % (ref 21–57)
TIBC: 324 ug/dL (ref 236–444)
UIBC: 230 ug/dL

## 2018-01-03 LAB — CBC WITH DIFFERENTIAL (CANCER CENTER ONLY)
BASOS PCT: 1 %
Basophils Absolute: 0 10*3/uL (ref 0.0–0.1)
Eosinophils Absolute: 0.3 10*3/uL (ref 0.0–0.5)
Eosinophils Relative: 4 %
HEMATOCRIT: 42 % (ref 34.8–46.6)
Hemoglobin: 14.1 g/dL (ref 11.6–15.9)
LYMPHS PCT: 28 %
Lymphs Abs: 2.1 10*3/uL (ref 0.9–3.3)
MCH: 31.5 pg (ref 26.0–34.0)
MCHC: 33.6 g/dL (ref 32.0–36.0)
MCV: 94 fL (ref 81.0–101.0)
MONOS PCT: 9 %
Monocytes Absolute: 0.7 10*3/uL (ref 0.1–0.9)
NEUTROS ABS: 4.2 10*3/uL (ref 1.5–6.5)
NEUTROS PCT: 58 %
Platelet Count: 225 10*3/uL (ref 145–400)
RBC: 4.47 MIL/uL (ref 3.70–5.32)
RDW: 12.5 % (ref 11.1–15.7)
WBC Count: 7.3 10*3/uL (ref 3.9–10.0)

## 2018-01-03 LAB — FERRITIN: Ferritin: 69 ng/mL (ref 9–269)

## 2018-01-03 NOTE — Progress Notes (Signed)
Hematology and Oncology Follow Up Visit  Meghan Santos [id number] May 08, 1935 83 y.o. 01/03/2018   Principle Diagnosis:  -Stage IIB (T2 N1 M0) lobular carcinoma of the left breast. 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  Meghan Santos is back for her follow-up. She is doing okay.  We see her every 6 months.  So far, she has had no problems.  She is a little bit worried about gaining too much weight.  Her husband was in the hospital back in February.  He is on Xarelto.  He apparently had a bleeding as hemoglobin was 4.    She is active.  She is doing things in the yard.  It sounds like she will try to grow another large watermelon like she did last year.  She has had no issues with nausea or vomiting.  She has had no obvious bleeding.  We last saw her, her iron studies look good.  Her ferritin was 52 with an iron saturation of 38%.  She still gets mammograms.  I think she is due for another mammogram soon.    Overall, her performance status is ECOG 1.  Medications:  Current Outpatient Medications:  .  aspirin 81 MG tablet, Take 81 mg by mouth., Disp: , Rfl:  .  benazepril-hydrochlorthiazide (LOTENSIN HCT) 10-12.5 MG per tablet, daily. , Disp: , Rfl:  .  Calcium Carbonate (CALCIUM 600 PO), Take 1 tablet by mouth daily., Disp: , Rfl:  .  Cholecalciferol (VITAMIN D) 2000 UNITS tablet, Take 2,000 Units by mouth daily., Disp: , Rfl:  .  Coenzyme Q10 (CO Q-10) 100 MG CAPS, Take 300 mg by mouth every morning. , Disp: , Rfl:  .  CRESTOR 20 MG tablet, 20 mg daily. , Disp: , Rfl:  .  Cyanocobalamin (VITAMIN B 12 PO), Take by mouth every morning., Disp: , Rfl:  .  Ferrous Sulfate (IRON) 325 (65 Fe) MG TABS, TK 1 T PO QD, Disp: , Rfl: 6 .  furosemide (LASIX) 40 MG tablet, TAKE 2 TABLETS BY MOUTH DAILY, Disp: 180 tablet, Rfl: 3 .  levothyroxine (SYNTHROID, LEVOTHROID) 125 MCG  tablet, 125 mcg daily. , Disp: , Rfl:  .  LORazepam (ATIVAN) 1 MG tablet, , Disp: , Rfl:  .  metFORMIN (GLUCOPHAGE) 1000 MG tablet, TK 1 T PO D AT SUPPER TIME, Disp: , Rfl: 0 .  Multiple Vitamin (MULTI-VITAMIN DAILY PO), Take by mouth every morning., Disp: , Rfl:  .  potassium chloride SA (K-DUR,KLOR-CON) 20 MEQ tablet, TAKE 1 TABLET(20 MEQ) BY MOUTH DAILY, Disp: 90 tablet, Rfl: 1 .  sitaGLIPtin (JANUVIA) 100 MG tablet, Take 100 mg by mouth daily. Unknown dose , Disp: , Rfl:   Allergies: No Known Allergies  Past Medical History, Surgical history, Social history, and Family History were reviewed and updated.  Review of Systems: Review of Systems  Constitutional: Negative.   HENT: Negative.   Eyes: Negative.   Respiratory: Negative.   Cardiovascular: Negative.   Gastrointestinal: Negative.   Genitourinary: Negative.   Musculoskeletal: Negative.   Skin: Negative.   Neurological: Negative.   Endo/Heme/Allergies: Negative.   Psychiatric/Behavioral: Negative.      Physical Exam:  weight is 181 lb (82.1 kg). Her oral temperature is 97.7 F (36.5 C). Her blood pressure is 126/54 (abnormal) and her pulse is 71. Her respiration is 17 and oxygen saturation is 97%.  Physical Exam  Constitutional: She is oriented to person, place, and time.  HENT:  Head: Normocephalic and atraumatic.  Mouth/Throat: Oropharynx is clear and moist.  Eyes: Pupils are equal, round, and reactive to light. EOM are normal.  Neck: Normal range of motion.  Cardiovascular: Normal rate, regular rhythm and normal heart sounds.  Pulmonary/Chest: Effort normal and breath sounds normal.  Abdominal: Soft. Bowel sounds are normal.  Musculoskeletal: Normal range of motion. She exhibits no edema, tenderness or deformity.  Lymphadenopathy:    She has no cervical adenopathy.  Neurological: She is alert and oriented to person, place, and time.  Skin: Skin is warm and dry. No rash noted. No erythema.  Psychiatric: She has  a normal mood and affect. Her behavior is normal. Judgment and thought content normal.  Vitals reviewed.    Lab Results  Component Value Date   WBC 7.3 01/03/2018   HGB 13.9 07/06/2017   HCT 42.0 01/03/2018   MCV 94.0 01/03/2018   PLT 225 01/03/2018     Chemistry      Component Value Date/Time   NA 143 07/06/2017 1000   NA 141 07/06/2016 1113   K 4.1 07/06/2017 1000   K 3.9 07/06/2016 1113   CL 100 07/06/2017 1000   CO2 32 07/06/2017 1000   CO2 29 07/06/2016 1113   BUN 11 07/06/2017 1000   BUN 20.4 07/06/2016 1113   CREATININE 1.0 07/06/2017 1000   CREATININE 0.9 07/06/2016 1113      Component Value Date/Time   CALCIUM 10.0 07/06/2017 1000   CALCIUM 10.9 (H) 07/06/2016 1113   ALKPHOS 48 07/06/2017 1000   ALKPHOS 73 07/06/2016 1113   AST 42 (H) 07/06/2017 1000   AST 31 07/06/2016 1113   ALT 35 07/06/2017 1000   ALT 23 07/06/2016 1113   BILITOT 0.80 07/06/2017 1000   BILITOT 0.35 07/06/2016 1113         Impression and Plan: [name] is 83 year old white female with a history of stage IIb lobular carcinoma the left breast. She was diagnosed back in 1998. She underwent systemic chemotherapy followed by Arimidex. She had radiation therapy.  She completed Arimidex when 2008.  I think that from a standpoint of breast cancer, everything looks fine without any evidence of recurrence. It is now been 20 years.  I do think that she needs something for the osteoporosis. 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  I talked to her about Prolia. I think this would be a good idea for her. I think that this would be well tolerated. I told her that this is an injection under the skin twice a year. It really has no side effects.  She will talk to her husband about this.  I told her to keep taking her vitamin D. She is on 2000 units daily. I think this is quite important for her.  I will plan to see her back in 6 months.   [name], MD 4/18/201910:38 AM

## 2018-01-04 ENCOUNTER — Telehealth: Payer: Self-pay | Admitting: *Deleted

## 2018-01-04 LAB — VITAMIN D 25 HYDROXY (VIT D DEFICIENCY, FRACTURES): VIT D 25 HYDROXY: 51.6 ng/mL (ref 30.0–100.0)

## 2018-01-04 NOTE — Telephone Encounter (Addendum)
Patient is aware of results  ----- Message from [name], MD sent at 01/04/2018  6:53 AM EDT ----- Call - Vit D level is ok!! Lattie Haw, Rudell Cobb, MD  P Onc Nurse Hp        Call - iron level is beter!! Meghan Santos

## 2018-01-10 ENCOUNTER — Ambulatory Visit
Admission: RE | Admit: 2018-01-10 | Discharge: 2018-01-10 | Disposition: A | Discharge status: Home / Self Care | Payer: Medicare Other | Source: Ambulatory Visit | Attending: Hematology & Oncology | Admitting: Hematology & Oncology

## 2018-01-10 DIAGNOSIS — Z1231 Encounter for screening mammogram for malignant neoplasm of breast: Secondary | ICD-10-CM

## 2018-03-29 DIAGNOSIS — H5213 Myopia, bilateral: Secondary | ICD-10-CM | POA: Diagnosis not present

## 2018-06-07 DIAGNOSIS — Z23 Encounter for immunization: Secondary | ICD-10-CM | POA: Diagnosis not present

## 2018-06-18 DIAGNOSIS — L738 Other specified follicular disorders: Secondary | ICD-10-CM | POA: Diagnosis not present

## 2018-06-18 DIAGNOSIS — D235 Other benign neoplasm of skin of trunk: Secondary | ICD-10-CM | POA: Diagnosis not present

## 2018-06-18 DIAGNOSIS — Z85828 Personal history of other malignant neoplasm of skin: Secondary | ICD-10-CM | POA: Diagnosis not present

## 2018-06-18 DIAGNOSIS — L570 Actinic keratosis: Secondary | ICD-10-CM | POA: Diagnosis not present

## 2018-06-18 DIAGNOSIS — C44311 Basal cell carcinoma of skin of nose: Secondary | ICD-10-CM | POA: Diagnosis not present

## 2018-06-24 ENCOUNTER — Other Ambulatory Visit: Payer: Self-pay | Admitting: Cardiovascular Disease

## 2018-06-24 NOTE — Telephone Encounter (Signed)
Rx request sent to pharmacy.  

## 2018-07-04 ENCOUNTER — Other Ambulatory Visit: Payer: Self-pay

## 2018-07-04 ENCOUNTER — Inpatient Hospital Stay: Payer: Medicare Other | Attending: Hematology | Admitting: Hematology

## 2018-07-04 ENCOUNTER — Other Ambulatory Visit: Payer: Self-pay | Admitting: Hematology

## 2018-07-04 ENCOUNTER — Inpatient Hospital Stay: Payer: Medicare Other

## 2018-07-04 ENCOUNTER — Encounter: Payer: Self-pay | Admitting: Hematology

## 2018-07-04 VITALS — BP 126/62 | HR 71 | Temp 98.4°F | Resp 16 | Wt 180.0 lb

## 2018-07-04 DIAGNOSIS — Z9221 Personal history of antineoplastic chemotherapy: Secondary | ICD-10-CM | POA: Insufficient documentation

## 2018-07-04 DIAGNOSIS — Z7982 Long term (current) use of aspirin: Secondary | ICD-10-CM | POA: Diagnosis not present

## 2018-07-04 DIAGNOSIS — Z853 Personal history of malignant neoplasm of breast: Secondary | ICD-10-CM | POA: Insufficient documentation

## 2018-07-04 DIAGNOSIS — M818 Other osteoporosis without current pathological fracture: Secondary | ICD-10-CM | POA: Insufficient documentation

## 2018-07-04 DIAGNOSIS — Z79899 Other long term (current) drug therapy: Secondary | ICD-10-CM | POA: Diagnosis not present

## 2018-07-04 DIAGNOSIS — D509 Iron deficiency anemia, unspecified: Secondary | ICD-10-CM | POA: Insufficient documentation

## 2018-07-04 DIAGNOSIS — D500 Iron deficiency anemia secondary to blood loss (chronic): Secondary | ICD-10-CM

## 2018-07-04 DIAGNOSIS — T386X5A Adverse effect of antigonadotrophins, antiestrogens, antiandrogens, not elsewhere classified, initial encounter: Secondary | ICD-10-CM

## 2018-07-04 LAB — CBC WITH DIFFERENTIAL (CANCER CENTER ONLY)
ABS IMMATURE GRANULOCYTES: 0.01 10*3/uL (ref 0.00–0.07)
Basophils Absolute: 0 10*3/uL (ref 0.0–0.1)
Basophils Relative: 1 %
Eosinophils Absolute: 0.1 10*3/uL (ref 0.0–0.5)
Eosinophils Relative: 3 %
HCT: 41.1 % (ref 36.0–46.0)
Hemoglobin: 13.3 g/dL (ref 12.0–15.0)
Immature Granulocytes: 0 %
LYMPHS PCT: 37 %
Lymphs Abs: 2 10*3/uL (ref 0.7–4.0)
MCH: 30.3 pg (ref 26.0–34.0)
MCHC: 32.4 g/dL (ref 30.0–36.0)
MCV: 93.6 fL (ref 80.0–100.0)
MONO ABS: 0.5 10*3/uL (ref 0.1–1.0)
MONOS PCT: 9 %
NEUTROS ABS: 2.9 10*3/uL (ref 1.7–7.7)
Neutrophils Relative %: 50 %
Platelet Count: 252 10*3/uL (ref 150–400)
RBC: 4.39 MIL/uL (ref 3.87–5.11)
RDW: 12.3 % (ref 11.5–15.5)
WBC Count: 5.6 10*3/uL (ref 4.0–10.5)
nRBC: 0 % (ref 0.0–0.2)

## 2018-07-04 LAB — CMP (CANCER CENTER ONLY)
ALT: 39 U/L (ref 0–44)
ANION GAP: 10 (ref 5–15)
AST: 47 U/L — ABNORMAL HIGH (ref 15–41)
Albumin: 4.1 g/dL (ref 3.5–5.0)
Alkaline Phosphatase: 53 U/L (ref 38–126)
BUN: 12 mg/dL (ref 8–23)
CALCIUM: 10.4 mg/dL — AB (ref 8.9–10.3)
CO2: 28 mmol/L (ref 22–32)
CREATININE: 0.9 mg/dL (ref 0.44–1.00)
Chloride: 102 mmol/L (ref 98–111)
GFR, EST NON AFRICAN AMERICAN: 58 mL/min — AB (ref >60)
GLUCOSE: 108 mg/dL — AB (ref 70–99)
Potassium: 3.8 mmol/L (ref 3.5–5.1)
SODIUM: 140 mmol/L (ref 135–145)
Total Bilirubin: 0.5 mg/dL (ref 0.3–1.2)
Total Protein: 7.5 g/dL (ref 6.5–8.1)

## 2018-07-04 NOTE — Progress Notes (Addendum)
[facility] OFFICE PROGRESS NOTE  Patient Care Team: Crist Infante, MD as PCP - General (Internal Medicine) Croitoru, Dani Gobble, MD as PCP - Cardiology (Cardiology)  HEME/ONC OVERVIEW: 1. Stage IIB (T2N1M0) ILC of the left breast - Diagnosed in 1998; treated with chemo and Arimidex - Completed Arimidex in 2008 2. Hx of iron deficiency anemia 3. Hx of osteoporosis secondary to aromatase inhibitor  Current Therapy:         Status post IV iron- given 05/31/2016  Denosumab (for osteoporosis) x 1 dose in 06/2017  ASSESSMENT & PLAN:  Remote history of Stage IIB ILC of the left breast -Most recent surveillance mammogram in April 2019 was negative for disease recurrence -Given the remote history of ILC, the risk of disease recurrence at this point is very small -I discussed with the patient regarding annual mammogram, next due in April 2020  Hx of iron deficiency anemia -Last IV iron administered in September 2017 -CBC normal today -Continue iron supplement every other day  History of osteoporosis secondary to aromatase inhibitor -Patient has a history of osteoporosis, for which she received 1 dose of denosumab in October 2018; she has not received any further treatment since then -Patient is scheduled for PCP follow-up in 1 week, and I have asked her to coordinate with her PCP to repeat DEXA scan, as she has not had one in over 3 years -Patient will send the DEXA scan results to the oncology clinic, and we will determine if she will need additional treatment for osteoporosis -On Ca-Vita D supplement   Orders Placed This Encounter  Procedures  . [facility] MAMMOGRAPHY    Standing Status:   Future    Standing Expiration Date:   07/05/2019    Scheduling Instructions:     The breast center in [place] [employer] [facility] and [address])  . CBC with Differential (Cancer Center Only)    Standing Status:   Future    Standing Expiration Date:   08/08/2019  . CMP ([facility] only)     Standing Status:   Future    Standing Expiration Date:   08/08/2019   All questions were answered. The patient knows to call the clinic with any problems, questions or concerns. No barriers to learning was detected.  A total of more than 25 minutes were spent face-to-face with the patient during this encounter and over half of that time was spent on counseling and coordination of care as outlined above.   Return to clinic in 20-month with Dr. Marin Olp for labs and clinic follow-up.  Tish Men, MD 07/04/2018 12:07 PM  CHIEF COMPLAINT: "I am doing well"  INTERVAL HISTORY: Meghan Santos returns to oncology clinic for follow-up of remote history of stage II invasive lobular carcinoma.  She reports that she has been doing very well, including gardening and working the yard with no limitation.  She denies any fever, chill, weight loss, night sweats, lymphadenopathy, chest pain or dyspnea.  SUMMARY OF ONCOLOGIC HISTORY:  No history exists.    REVIEW OF SYSTEMS:   Constitutional: ( - ) fevers, ( - )  chills , ( - ) night sweats Eyes: ( - ) blurriness of vision, ( - ) double vision, ( - ) watery eyes Ears, nose, mouth, throat, and face: ( - ) mucositis, ( - ) sore throat Respiratory: ( - ) cough, ( - ) dyspnea, ( - ) wheezes Cardiovascular: ( - ) palpitation, ( - ) chest discomfort, ( - ) lower extremity swelling Gastrointestinal:  ( - )  nausea, ( - ) heartburn, ( - ) change in bowel habits Skin: ( - ) abnormal skin rashes Lymphatics: ( - ) new lymphadenopathy, ( - ) easy bruising Neurological: ( - ) numbness, ( - ) tingling, ( - ) new weaknesses Behavioral/Psych: ( - ) mood change, ( - ) new changes  All other systems were reviewed with the patient and are negative.  I have reviewed the past medical history, past surgical history, social history and family history with the patient and they are unchanged from previous note.  ALLERGIES:  has No Known Allergies.  MEDICATIONS:  Current Outpatient  Medications  Medication Sig Dispense Refill  . aspirin 81 MG tablet Take 81 mg by mouth.    . benazepril-hydrochlorthiazide (LOTENSIN HCT) 10-12.5 MG per tablet daily.     . Calcium Carbonate (CALCIUM 600 PO) Take 1 tablet by mouth daily.    . Cholecalciferol (VITAMIN D) 2000 UNITS tablet Take 2,000 Units by mouth daily.    . Coenzyme Q10 (CO Q-10) 100 MG CAPS Take 300 mg by mouth every morning.     Marland Kitchen CRESTOR 20 MG tablet 20 mg daily.     . Cyanocobalamin (VITAMIN B 12 PO) Take by mouth every morning.    . Ferrous Sulfate (IRON) 325 (65 Fe) MG TABS TK 1 T PO QD  6  . furosemide (LASIX) 40 MG tablet TAKE 2 TABLETS BY MOUTH DAILY 180 tablet 3  . levothyroxine (SYNTHROID, LEVOTHROID) 125 MCG tablet 125 mcg daily.     Marland Kitchen LORazepam (ATIVAN) 1 MG tablet     . metFORMIN (GLUCOPHAGE) 1000 MG tablet TK 1 T PO D AT SUPPER TIME  0  . Multiple Vitamin (MULTI-VITAMIN DAILY PO) Take by mouth every morning.    . potassium chloride SA (K-DUR,KLOR-CON) 20 MEQ tablet TAKE 1 TABLET(20 MEQ) BY MOUTH DAILY 90 tablet 0  . sitaGLIPtin (JANUVIA) 100 MG tablet Take 100 mg by mouth daily. Unknown dose      No current facility-administered medications for this visit.     PHYSICAL EXAMINATION: ECOG PERFORMANCE STATUS: 0 - Asymptomatic  Today's Vitals   07/04/18 1134  BP: 126/62  Pulse: 71  Resp: 16  Temp: 98.4 F (36.9 C)  TempSrc: Oral  SpO2: 100%  Weight: 180 lb (81.6 kg)  PainSc: 0-No pain   Body mass index is 28.62 kg/m.  Filed Weights   07/04/18 1134  Weight: 180 lb (81.6 kg)    GENERAL: alert, no distress and comfortable SKIN: skin color, texture, turgor are normal, no rashes or significant lesions EYES: conjunctiva are pink and non-injected, sclera clear OROPHARYNX: no exudate, no erythema; lips, buccal mucosa, and tongue normal  NECK: supple, non-tender LYMPH:  no palpable lymphadenopathy in the cervical or axillary  LUNGS: clear to auscultation and percussion with normal breathing  effort HEART: regular rate & rhythm and no murmurs and no lower extremity edema ABDOMEN: soft, non-tender, non-distended, normal bowel sounds Musculoskeletal: no cyanosis of digits and no clubbing  PSYCH: alert & oriented x 3, fluent speech NEURO: no focal motor/sensory deficits  LABORATORY DATA:  I have reviewed the data as listed    Component Value Date/Time   NA 141 01/03/2018 0918   NA 143 07/06/2017 1000   NA 141 07/06/2016 1113   K 3.9 01/03/2018 0918   K 4.1 07/06/2017 1000   K 3.9 07/06/2016 1113   CL 100 01/03/2018 0918   CL 100 07/06/2017 1000   CO2 33 (H) 01/03/2018 6295  CO2 32 07/06/2017 1000   CO2 29 07/06/2016 1113   GLUCOSE 94 01/03/2018 0918   GLUCOSE 124 (H) 07/06/2017 1000   BUN 19 01/03/2018 0918   BUN 11 07/06/2017 1000   BUN 20.4 07/06/2016 1113   CREATININE 0.97 01/03/2018 0918   CREATININE 1.0 07/06/2017 1000   CREATININE 0.9 07/06/2016 1113   CALCIUM 10.5 (H) 01/03/2018 0918   CALCIUM 10.0 07/06/2017 1000   CALCIUM 10.9 (H) 07/06/2016 1113   PROT 7.9 01/03/2018 0918   PROT 7.6 07/06/2017 1000   PROT 7.8 07/06/2016 1113   ALBUMIN 4.2 01/03/2018 0918   ALBUMIN 3.6 07/06/2017 1000   ALBUMIN 4.0 07/06/2016 1113   AST 38 (H) 01/03/2018 0918   AST 31 07/06/2016 1113   ALT 32 01/03/2018 0918   ALT 35 07/06/2017 1000   ALT 23 07/06/2016 1113   ALKPHOS 49 01/03/2018 0918   ALKPHOS 48 07/06/2017 1000   ALKPHOS 73 07/06/2016 1113   BILITOT 0.4 01/03/2018 0918   BILITOT 0.35 07/06/2016 1113   GFRNONAA 53 (L) 01/03/2018 0918   GFRAA >60 01/03/2018 0918    No results found for: SPEP, UPEP  Lab Results  Component Value Date   WBC 5.6 07/04/2018   NEUTROABS 2.9 07/04/2018   HGB 13.3 07/04/2018   HCT 41.1 07/04/2018   MCV 93.6 07/04/2018   PLT 252 07/04/2018      Chemistry      Component Value Date/Time   NA 141 01/03/2018 0918   NA 143 07/06/2017 1000   NA 141 07/06/2016 1113   K 3.9 01/03/2018 0918   K 4.1 07/06/2017 1000   K 3.9  07/06/2016 1113   CL 100 01/03/2018 0918   CL 100 07/06/2017 1000   CO2 33 (H) 01/03/2018 0918   CO2 32 07/06/2017 1000   CO2 29 07/06/2016 1113   BUN 19 01/03/2018 0918   BUN 11 07/06/2017 1000   BUN 20.4 07/06/2016 1113   CREATININE 0.97 01/03/2018 0918   CREATININE 1.0 07/06/2017 1000   CREATININE 0.9 07/06/2016 1113      Component Value Date/Time   CALCIUM 10.5 (H) 01/03/2018 0918   CALCIUM 10.0 07/06/2017 1000   CALCIUM 10.9 (H) 07/06/2016 1113   ALKPHOS 49 01/03/2018 0918   ALKPHOS 48 07/06/2017 1000   ALKPHOS 73 07/06/2016 1113   AST 38 (H) 01/03/2018 0918   AST 31 07/06/2016 1113   ALT 32 01/03/2018 0918   ALT 35 07/06/2017 1000   ALT 23 07/06/2016 1113   BILITOT 0.4 01/03/2018 0918   BILITOT 0.35 07/06/2016 1113

## 2018-07-04 NOTE — Addendum Note (Signed)
Addended by: Tish Men on: 07/04/2018 12:07 PM   Modules accepted: Orders

## 2018-07-05 LAB — IRON AND TIBC
IRON: 114 ug/dL (ref 41–142)
Saturation Ratios: 31 % (ref 21–57)
TIBC: 364 ug/dL (ref 236–444)
UIBC: 250 ug/dL

## 2018-07-05 LAB — FERRITIN: FERRITIN: 51 ng/mL (ref 11–307)

## 2018-07-08 DIAGNOSIS — E7849 Other hyperlipidemia: Secondary | ICD-10-CM | POA: Diagnosis not present

## 2018-07-08 DIAGNOSIS — I10 Essential (primary) hypertension: Secondary | ICD-10-CM | POA: Diagnosis not present

## 2018-07-08 DIAGNOSIS — E038 Other specified hypothyroidism: Secondary | ICD-10-CM | POA: Diagnosis not present

## 2018-07-08 DIAGNOSIS — R82998 Other abnormal findings in urine: Secondary | ICD-10-CM | POA: Diagnosis not present

## 2018-07-08 DIAGNOSIS — E538 Deficiency of other specified B group vitamins: Secondary | ICD-10-CM | POA: Diagnosis not present

## 2018-07-12 DIAGNOSIS — I350 Nonrheumatic aortic (valve) stenosis: Secondary | ICD-10-CM | POA: Diagnosis not present

## 2018-07-12 DIAGNOSIS — C50919 Malignant neoplasm of unspecified site of unspecified female breast: Secondary | ICD-10-CM | POA: Diagnosis not present

## 2018-07-12 DIAGNOSIS — M859 Disorder of bone density and structure, unspecified: Secondary | ICD-10-CM | POA: Diagnosis not present

## 2018-07-12 DIAGNOSIS — Z0000 Encounter for general adult medical examination without abnormal findings: Secondary | ICD-10-CM | POA: Diagnosis not present

## 2018-07-12 DIAGNOSIS — Z1212 Encounter for screening for malignant neoplasm of rectum: Secondary | ICD-10-CM | POA: Diagnosis not present

## 2018-07-12 DIAGNOSIS — I10 Essential (primary) hypertension: Secondary | ICD-10-CM | POA: Diagnosis not present

## 2018-07-24 DIAGNOSIS — C44311 Basal cell carcinoma of skin of nose: Secondary | ICD-10-CM | POA: Diagnosis not present

## 2018-07-24 DIAGNOSIS — Z85828 Personal history of other malignant neoplasm of skin: Secondary | ICD-10-CM | POA: Diagnosis not present

## 2018-09-23 ENCOUNTER — Other Ambulatory Visit: Payer: Self-pay | Admitting: Cardiovascular Disease

## 2018-09-25 ENCOUNTER — Telehealth: Payer: Self-pay | Admitting: Cardiovascular Disease

## 2018-09-25 NOTE — Telephone Encounter (Signed)
New Message   *STAT* If patient is at the pharmacy, call can be transferred to refill team.   1. Which medications need to be refilled? (please list name of each medication and dose if known) Potassium [id number] tab   2. Which pharmacy/location (including street and city if local pharmacy) is medication to be sent to? [facility] on [address]   3. Do they need a 30 day or 90 day supply? 90 day supply

## 2018-10-10 ENCOUNTER — Encounter: Payer: Self-pay | Admitting: Physician Assistant

## 2018-10-10 ENCOUNTER — Ambulatory Visit: Payer: Medicare Other | Admitting: Physician Assistant

## 2018-10-10 VITALS — BP 114/82 | HR 61 | Ht 66.5 in | Wt 179.8 lb

## 2018-10-10 DIAGNOSIS — I350 Nonrheumatic aortic (valve) stenosis: Secondary | ICD-10-CM

## 2018-10-10 DIAGNOSIS — E785 Hyperlipidemia, unspecified: Secondary | ICD-10-CM | POA: Diagnosis not present

## 2018-10-10 DIAGNOSIS — I10 Essential (primary) hypertension: Secondary | ICD-10-CM

## 2018-10-10 DIAGNOSIS — I5032 Chronic diastolic (congestive) heart failure: Secondary | ICD-10-CM | POA: Diagnosis not present

## 2018-10-10 MED ORDER — FUROSEMIDE 40 MG PO TABS: 80.0000 mg | ORAL_TABLET | Freq: Every day | ORAL | 3 refills | Status: DC

## 2018-10-10 MED ORDER — POTASSIUM CHLORIDE CRYS ER 20 MEQ PO TBCR: EXTENDED_RELEASE_TABLET | ORAL | 3 refills | Status: DC

## 2018-10-10 NOTE — Progress Notes (Signed)
Cardiology Office Note    Date:  10/13/2018   ID:  Meghan Santos, DOB [birth year], MRN [id number]  PCP:  Crist Infante, MD  Cardiologist: Dr. Sallyanne Kuster  Chief Complaint  Patient presents with  . Follow-up    seen for Dr. Sallyanne Kuster    History of Present Illness:  Meghan Santos is a 84 y.o. female with PMH of hypertension, hyperlipidemia, chronic iron deficiency anemia, mild aortic stenosis and chronic diastolic heart failure.  She had a history of breast cancer in 1998 and underwent chemo with Adriamycin and radiation therapy.  Echocardiogram obtained on 09/17/2014 showed EF 50 to 55%, grade 1 DD, mild aortic stenosis, trivial aortic regurgitation.  Myoview obtained on 09/17/2014 showed EF 63%, very small apical lateral hypocontractility.   Patient presents today for cardiology office visit.  She denies any recent chest pain, shortness of breath, lower extremity edema, orthopnea or PND.  On physical exam, the aortic stenosis is not very obvious.  Given lack of symptom and reassuring physical exam, I will hold off on obtaining a repeat echocardiogram at this time.  Overall she has been doing very well and can follow-up in 1 year.    Past Medical History:  Diagnosis Date  . Breast cancer ([place]) 1998   left breast with radiation and chemo  . Diabetes ([place])   . Essential hypertension 09/05/2014  . Hypercholesteremia 09/05/2014  . Hypercholesterolemia   . Hypertension   . Mild obesity 09/05/2014    Past Surgical History:  Procedure Laterality Date  . BREAST LUMPECTOMY  1998   left breast  . BREAST LUMPECTOMY Left 11/1996  . TONSILLECTOMY  1958  . VAGINAL HYSTERECTOMY  1983    Current Medications: Outpatient Medications Prior to Visit  Medication Sig Dispense Refill  . aspirin 81 MG tablet Take 81 mg by mouth.    . benazepril-hydrochlorthiazide (LOTENSIN HCT) 10-12.5 MG per tablet daily.     . Calcium Carbonate (CALCIUM 600 PO) Take 1 tablet by mouth daily.    . Cholecalciferol  (VITAMIN D) 2000 UNITS tablet Take 2,000 Units by mouth daily.    . Coenzyme Q10 (CO Q-10) 100 MG CAPS Take 300 mg by mouth every morning.     . Cyanocobalamin (VITAMIN B 12 PO) Take by mouth every morning.    Marland Kitchen levothyroxine (SYNTHROID, LEVOTHROID) 125 MCG tablet 125 mcg daily.     Marland Kitchen LORazepam (ATIVAN) 1 MG tablet     . metFORMIN (GLUCOPHAGE) 1000 MG tablet TK 1 T PO D AT SUPPER TIME  0  . Multiple Vitamin (MULTI-VITAMIN DAILY PO) Take by mouth every morning.    . rosuvastatin (CRESTOR) 10 MG tablet 10 mg daily.     . sitaGLIPtin (JANUVIA) 100 MG tablet Take 100 mg by mouth daily. Unknown dose     . Ferrous Sulfate (IRON) 325 (65 Fe) MG TABS TK 1 T PO QD  6  . furosemide (LASIX) 40 MG tablet TAKE 2 TABLETS BY MOUTH DAILY 180 tablet 3  . potassium chloride SA (K-DUR,KLOR-CON) 20 MEQ tablet TAKE 1 TABLET(20 MEQ) BY MOUTH DAILY 90 tablet 3   No facility-administered medications prior to visit.      Allergies:   Patient has no known allergies.   Social History   Socioeconomic History  . Marital status: Married    Spouse name: Not on file  . Number of children: Not on file  . Years of education: Not on file  . Highest education level: Not on file  Occupational History  . Not on file  Social Needs  . Financial resource strain: Not on file  . Food insecurity:    Worry: Not on file    Inability: Not on file  . Transportation needs:    Medical: Not on file    Non-medical: Not on file  Tobacco Use  . Smoking status: Never Smoker  . Smokeless tobacco: Never Used  . Tobacco comment: never used tobacco  Substance and Sexual Activity  . Alcohol use: Yes    Alcohol/week: 0.0 standard drinks    Comment: occas.  . Drug use: No  . Sexual activity: Not on file  Lifestyle  . Physical activity:    Days per week: Not on file    Minutes per session: Not on file  . Stress: Not on file  Relationships  . Social connections:    Talks on phone: Not on file    Gets together: Not on file     Attends religious service: Not on file    Active member of club or organization: Not on file    Attends meetings of clubs or organizations: Not on file    Relationship status: Not on file  Other Topics Concern  . Not on file  Social History Narrative  . Not on file     Family History:  The patient's family history includes Alcoholism in her brother; Cancer in her sister; Dementia in her father; Diabetes in her brother; Heart attack in her mother.   ROS:   Please see the history of present illness.    ROS All other systems reviewed and are negative.   PHYSICAL EXAM:   VS:  BP 114/82   Pulse 61   Ht 5' 6.5" (1.689 m)   Wt 179 lb 12.8 oz (81.6 kg)   BMI 28.59 kg/m    GEN: Well nourished, well developed, in no acute distress  HEENT: normal  Neck: no JVD, carotid bruits, or masses Cardiac: RRR; no murmurs, rubs, or gallops,no edema  Respiratory:  clear to auscultation bilaterally, normal work of breathing GI: soft, nontender, nondistended, + BS MS: no deformity or atrophy  Skin: warm and dry, no rash Neuro:  Alert and Oriented x 3, Strength and sensation are intact Psych: euthymic mood, full affect  Wt Readings from Last 3 Encounters:  10/10/18 179 lb 12.8 oz (81.6 kg)  07/04/18 180 lb (81.6 kg)  01/03/18 181 lb (82.1 kg)      Studies/Labs Reviewed:   EKG:  EKG is ordered today.  The ekg ordered today demonstrates normal sinus rhythm, rare PVC  Recent Labs: 07/04/2018: ALT 39; BUN 12; Creatinine 0.90; Hemoglobin 13.3; Platelet Count 252; Potassium 3.8; Sodium 140   Lipid Panel No results found for: CHOL, TRIG, HDL, CHOLHDL, VLDL, LDLCALC, LDLDIRECT  Additional studies/ records that were reviewed today include:   Echo 09/17/2014 LV EF: 50% -  24 Study Conclusions  - Left ventricle: The cavity size was normal. Wall thickness was normal. Systolic function was normal. The estimated ejection fraction was in the range of 50% to 55%. Wall motion was  normal; there were no regional wall motion abnormalities. Doppler parameters are consistent with abnormal left ventricular relaxation (grade 1 diastolic dysfunction). - Aortic valve: Moderately calcified annulus. Moderately thickened, mildly calcified leaflets. There was mild stenosis. There was trivial regurgitation. Valve area (VTI): 1.74 cm^2. Valve area (Vmax): 1.77 cm^2. Valve area (Vmean): 1.68 cm^2.   Myoview 09/17/2014 Impression Exercise Capacity:  Poor exercise capacity. BP Response:  Normal blood pressure response. Clinical Symptoms:  Moderate shortness of breath ECG Impression:  No significant ST segment change suggestive of ischemia. Comparison with Prior Nuclear Study: No previous nuclear study performed  Overall Impression:  Intermediate risk stress nuclear study demostrating a small region of moderate apical ischemia.  LV Wall Motion:  NL LV Function, EF 63%; with a very small region of apical lateral hypocontractility.   ASSESSMENT:    1. Chronic diastolic heart failure ([place])   2. Mild aortic stenosis   3. Essential hypertension   4. Hyperlipidemia, unspecified hyperlipidemia type      PLAN:  In order of problems listed above:  1. Chronic diastolic heart failure: Appears to be euvolemic on physical exam.  Denies any recent shortness of breath or chest discomfort.  Continue on 80 mg daily of Lasix.  Last lab work in October was reassuring with stable renal function.  2. Mild aortic stenosis: Seen on previous echocardiogram in 2015.  Patient is completely asymptomatic and physical exam does not reveal any significant heart murmur.  We will hold off on repeating echocardiogram at this time.  3. Hyperlipidemia: On Crestor 10 mg daily.  Will defer annual lipid panel to primary care provider.  4. Hypertension: Blood pressure well controlled on current therapy.    Medication Adjustments/Labs and Tests Ordered: Current medicines are reviewed at  length with the patient today.  Concerns regarding medicines are outlined above.  Medication changes, Labs and Tests ordered today are listed in the Patient Instructions below. Patient Instructions  Medication Instructions:  The current medical regimen is effective;  continue present plan and medications.  If you need a refill on your cardiac medications before your next appointment, please call your pharmacy.    Follow-Up: At [facility], you and your health needs are our priority.  As part of our continuing mission to provide you with exceptional heart care, we have created designated Provider Care Teams.  These Care Teams include your primary Cardiologist (physician) and Advanced Practice Providers (APPs -  Physician Assistants and Nurse Practitioners) who all work together to provide you with the care you need, when you need it. You will need a follow up appointment in 12 months.  Please call our office 2 months in advance to schedule this appointment.  You may see Sanda Klein, MD or one of the following Advanced Practice Providers on your designated Care Team: [place], [place] . Fabian Sharp, PA-C       Signed, [place], [place]  10/13/2018 12:28 AM    [facility] [address], [place], [place]  [address] Phone: [phone]; Fax: [phone]

## 2018-10-10 NOTE — Patient Instructions (Signed)
Medication Instructions:  The current medical regimen is effective;  continue present plan and medications.  If you need a refill on your cardiac medications before your next appointment, please call your pharmacy.    Follow-Up: At [facility], you and your health needs are our priority.  As part of our continuing mission to provide you with exceptional heart care, we have created designated Provider Care Teams.  These Care Teams include your primary Cardiologist (physician) and Advanced Practice Providers (APPs -  Physician Assistants and Nurse Practitioners) who all work together to provide you with the care you need, when you need it. You will need a follow up appointment in 12 months.  Please call our office 2 months in advance to schedule this appointment.  You may see Mihai Croitoru, MD or one of the following Advanced Practice Providers on your designated Care Team: Hao Meng, PA-C . Angela Duke, PA-C    

## 2018-10-13 ENCOUNTER — Encounter: Payer: Self-pay | Admitting: Physician Assistant

## 2018-10-14 NOTE — Progress Notes (Signed)
Great, thanks MCr 

## 2018-11-11 DIAGNOSIS — E1169 Type 2 diabetes mellitus with other specified complication: Secondary | ICD-10-CM | POA: Diagnosis not present

## 2018-11-11 DIAGNOSIS — R945 Abnormal results of liver function studies: Secondary | ICD-10-CM | POA: Diagnosis not present

## 2018-11-14 DIAGNOSIS — R2689 Other abnormalities of gait and mobility: Secondary | ICD-10-CM | POA: Diagnosis not present

## 2018-11-14 DIAGNOSIS — I350 Nonrheumatic aortic (valve) stenosis: Secondary | ICD-10-CM | POA: Diagnosis not present

## 2018-11-14 DIAGNOSIS — C50919 Malignant neoplasm of unspecified site of unspecified female breast: Secondary | ICD-10-CM | POA: Diagnosis not present

## 2018-11-14 DIAGNOSIS — E1169 Type 2 diabetes mellitus with other specified complication: Secondary | ICD-10-CM | POA: Diagnosis not present

## 2019-01-03 ENCOUNTER — Other Ambulatory Visit: Payer: Medicare Other

## 2019-01-03 ENCOUNTER — Ambulatory Visit: Payer: Medicare Other | Admitting: Hematology & Oncology

## 2019-02-04 DIAGNOSIS — L821 Other seborrheic keratosis: Secondary | ICD-10-CM | POA: Diagnosis not present

## 2019-02-04 DIAGNOSIS — D225 Melanocytic nevi of trunk: Secondary | ICD-10-CM | POA: Diagnosis not present

## 2019-02-04 DIAGNOSIS — Z85828 Personal history of other malignant neoplasm of skin: Secondary | ICD-10-CM | POA: Diagnosis not present

## 2019-02-04 DIAGNOSIS — D692 Other nonthrombocytopenic purpura: Secondary | ICD-10-CM | POA: Diagnosis not present

## 2019-02-24 ENCOUNTER — Other Ambulatory Visit: Payer: Self-pay

## 2019-02-24 ENCOUNTER — Encounter: Payer: Self-pay | Admitting: Hematology & Oncology

## 2019-02-24 ENCOUNTER — Inpatient Hospital Stay: Payer: Medicare Other | Attending: Hematology & Oncology

## 2019-02-24 ENCOUNTER — Inpatient Hospital Stay (HOSPITAL_BASED_OUTPATIENT_CLINIC_OR_DEPARTMENT_OTHER): Payer: Medicare Other | Admitting: Hematology & Oncology

## 2019-02-24 ENCOUNTER — Other Ambulatory Visit: Payer: Self-pay | Admitting: Hematology & Oncology

## 2019-02-24 VITALS — BP 118/42 | HR 64 | Temp 98.2°F | Resp 18 | Ht 66.5 in | Wt 172.1 lb

## 2019-02-24 DIAGNOSIS — Z79899 Other long term (current) drug therapy: Secondary | ICD-10-CM | POA: Insufficient documentation

## 2019-02-24 DIAGNOSIS — M818 Other osteoporosis without current pathological fracture: Secondary | ICD-10-CM | POA: Diagnosis not present

## 2019-02-24 DIAGNOSIS — C50912 Malignant neoplasm of unspecified site of left female breast: Secondary | ICD-10-CM | POA: Insufficient documentation

## 2019-02-24 DIAGNOSIS — Z170 Estrogen receptor positive status [ER+]: Secondary | ICD-10-CM

## 2019-02-24 DIAGNOSIS — Z7982 Long term (current) use of aspirin: Secondary | ICD-10-CM | POA: Diagnosis not present

## 2019-02-24 DIAGNOSIS — Z853 Personal history of malignant neoplasm of breast: Secondary | ICD-10-CM

## 2019-02-24 DIAGNOSIS — D500 Iron deficiency anemia secondary to blood loss (chronic): Secondary | ICD-10-CM

## 2019-02-24 DIAGNOSIS — Z7984 Long term (current) use of oral hypoglycemic drugs: Secondary | ICD-10-CM

## 2019-02-24 DIAGNOSIS — Z923 Personal history of irradiation: Secondary | ICD-10-CM | POA: Diagnosis not present

## 2019-02-24 DIAGNOSIS — D509 Iron deficiency anemia, unspecified: Secondary | ICD-10-CM | POA: Diagnosis not present

## 2019-02-24 DIAGNOSIS — Z79811 Long term (current) use of aromatase inhibitors: Secondary | ICD-10-CM | POA: Insufficient documentation

## 2019-02-24 DIAGNOSIS — Z1231 Encounter for screening mammogram for malignant neoplasm of breast: Secondary | ICD-10-CM

## 2019-02-24 LAB — CMP (CANCER CENTER ONLY)
ALT: 25 U/L (ref 0–44)
AST: 33 U/L (ref 15–41)
Albumin: 4.5 g/dL (ref 3.5–5.0)
Alkaline Phosphatase: 49 U/L (ref 38–126)
Anion gap: 7 (ref 5–15)
BUN: 18 mg/dL (ref 8–23)
CO2: 32 mmol/L (ref 22–32)
Calcium: 10.5 mg/dL — ABNORMAL HIGH (ref 8.9–10.3)
Chloride: 101 mmol/L (ref 98–111)
Creatinine: 1.01 mg/dL — ABNORMAL HIGH (ref 0.44–1.00)
GFR, Est AFR Am: >60 mL/min (ref >60)
GFR, Estimated: 52 mL/min — ABNORMAL LOW (ref >60)
Glucose, Bld: 113 mg/dL — ABNORMAL HIGH (ref 70–99)
Potassium: 4.5 mmol/L (ref 3.5–5.1)
Sodium: 140 mmol/L (ref 135–145)
Total Bilirubin: 0.5 mg/dL (ref 0.3–1.2)
Total Protein: 7.3 g/dL (ref 6.5–8.1)

## 2019-02-24 LAB — CBC WITH DIFFERENTIAL (CANCER CENTER ONLY)
Abs Immature Granulocytes: 0.02 10*3/uL (ref 0.00–0.07)
Basophils Absolute: 0 10*3/uL (ref 0.0–0.1)
Basophils Relative: 1 %
Eosinophils Absolute: 0.1 10*3/uL (ref 0.0–0.5)
Eosinophils Relative: 2 %
HCT: 40.6 % (ref 36.0–46.0)
Hemoglobin: 13.2 g/dL (ref 12.0–15.0)
Immature Granulocytes: 0 %
Lymphocytes Relative: 38 %
Lymphs Abs: 2.8 10*3/uL (ref 0.7–4.0)
MCH: 30.4 pg (ref 26.0–34.0)
MCHC: 32.5 g/dL (ref 30.0–36.0)
MCV: 93.5 fL (ref 80.0–100.0)
Monocytes Absolute: 0.6 10*3/uL (ref 0.1–1.0)
Monocytes Relative: 8 %
Neutro Abs: 3.9 10*3/uL (ref 1.7–7.7)
Neutrophils Relative %: 51 %
Platelet Count: 241 10*3/uL (ref 150–400)
RBC: 4.34 MIL/uL (ref 3.87–5.11)
RDW: 12.3 % (ref 11.5–15.5)
WBC Count: 7.4 10*3/uL (ref 4.0–10.5)
nRBC: 0 % (ref 0.0–0.2)

## 2019-02-24 NOTE — Progress Notes (Signed)
Hematology and Oncology Follow Up Visit  Griselda Bramblett Mikles [id number] Dec 13, 1934 84 y.o. 02/24/2019   Principle Diagnosis:  -Stage IIB (T2 N1 M0) lobular carcinoma of the left breast. 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  Mcglothlin is back for her follow-up. She is doing okay.  We see her every 6 months.  So far, so far, everything is been going pretty well for her despite the coronavirus.  She and her husband are basically staying home.  They rarely go out.  She has had no problems with bleeding.  There is no problems with weight loss.  She has had no pain issues.  He has had no cough or shortness of breath.  There is been no leg swelling.  We last checked her iron studies back in October 2019, her ferritin was 51 with an iron saturation of 31%.  She is not a vegetarian.  She is eating what she would like.  She has had no rashes.  There is been no cough.  There is been no obvious shortness of breath.  Overall, her performance status is ECOG 1.  Medications:  Current Outpatient Medications:  .  aspirin 81 MG tablet, Take 81 mg by mouth., Disp: , Rfl:  .  benazepril-hydrochlorthiazide (LOTENSIN HCT) 10-12.5 MG per tablet, daily. , Disp: , Rfl:  .  Calcium Carbonate (CALCIUM 600 PO), Take 1 tablet by mouth daily., Disp: , Rfl:  .  Cholecalciferol (VITAMIN D) 2000 UNITS tablet, Take 2,000 Units by mouth daily., Disp: , Rfl:  .  Coenzyme Q10 (CO Q-10) 100 MG CAPS, Take 300 mg by mouth every morning. , Disp: , Rfl:  .  Cyanocobalamin (VITAMIN B 12 PO), Take by mouth every morning., Disp: , Rfl:  .  furosemide (LASIX) 40 MG tablet, Take 2 tablets (80 mg total) by mouth daily., Disp: 180 tablet, Rfl: 3 .  levothyroxine (SYNTHROID, LEVOTHROID) 125 MCG tablet, 125 mcg daily. , Disp: , Rfl:  .  metFORMIN (GLUCOPHAGE) 1000 MG tablet, TK 1 T PO D AT SUPPER TIME, Disp: , Rfl:  0 .  Multiple Vitamin (MULTI-VITAMIN DAILY PO), Take by mouth every morning., Disp: , Rfl:  .  potassium chloride SA (K-DUR,KLOR-CON) 20 MEQ tablet, TAKE 1 TABLET(20 MEQ) BY MOUTH DAILY, Disp: 90 tablet, Rfl: 3 .  rosuvastatin (CRESTOR) 10 MG tablet, 10 mg daily. , Disp: , Rfl:  .  sitaGLIPtin (JANUVIA) 100 MG tablet, Take 100 mg by mouth daily. Unknown dose , Disp: , Rfl:   Allergies: No Known Allergies  Past Medical History, Surgical history, Social history, and Family History were reviewed and updated.  Review of Systems: Review of Systems  Constitutional: Negative.   HENT: Negative.   Eyes: Negative.   Respiratory: Negative.   Cardiovascular: Negative.   Gastrointestinal: Negative.   Genitourinary: Negative.   Musculoskeletal: Negative.   Skin: Negative.   Neurological: Negative.   Endo/Heme/Allergies: Negative.   Psychiatric/Behavioral: Negative.      Physical Exam:  height is 5' 6.5" (1.689 m) and weight is 172 lb 1.9 oz (78.1 kg). Her oral temperature is 98.2 F (36.8 C). Her blood pressure is 118/42 (abnormal) and her pulse is 64. Her respiration is 18 and oxygen saturation is 100%.   Physical Exam Vitals signs reviewed.  Constitutional:      Comments: Breast exam shows right breast no masses edema or  erythema.  There is no right axillary adenopathy.  Left breast shows healed lobectomy at the 5 5 position at the edge of the areola.  There is a little bit of firmness there is no distinct mass.  There is some slight contraction of the left breast.  She has a well-healed lymphadenectomy scar in the left upper quadrant.  There is no left axillary adenopathy.  HENT:     Head: Normocephalic and atraumatic.  Eyes:     Pupils: Pupils are equal, round, and reactive to light.  Neck:     Musculoskeletal: Normal range of motion.  Cardiovascular:     Rate and Rhythm: Normal rate and regular rhythm.     Heart sounds: Normal heart sounds.  Pulmonary:     Effort: Pulmonary effort  is normal.     Breath sounds: Normal breath sounds.  Abdominal:     General: Bowel sounds are normal.     Palpations: Abdomen is soft.  Musculoskeletal: Normal range of motion.        General: No tenderness or deformity.  Lymphadenopathy:     Cervical: No cervical adenopathy.  Skin:    General: Skin is warm and dry.     Findings: No erythema or rash.  Neurological:     Mental Status: She is alert and oriented to person, place, and time.  Psychiatric:        Behavior: Behavior normal.        Thought Content: Thought content normal.        Judgment: Judgment normal.      Lab Results  Component Value Date   WBC 7.4 02/24/2019   HGB 13.2 02/24/2019   HCT 40.6 02/24/2019   MCV 93.5 02/24/2019   PLT 241 02/24/2019     Chemistry      Component Value Date/Time   NA 140 02/24/2019 1156   NA 143 07/06/2017 1000   NA 141 07/06/2016 1113   K 4.5 02/24/2019 1156   K 4.1 07/06/2017 1000   K 3.9 07/06/2016 1113   CL 101 02/24/2019 1156   CL 100 07/06/2017 1000   CO2 32 02/24/2019 1156   CO2 32 07/06/2017 1000   CO2 29 07/06/2016 1113   BUN 18 02/24/2019 1156   BUN 11 07/06/2017 1000   BUN 20.4 07/06/2016 1113   CREATININE 1.01 (H) 02/24/2019 1156   CREATININE 1.0 07/06/2017 1000   CREATININE 0.9 07/06/2016 1113      Component Value Date/Time   CALCIUM 10.5 (H) 02/24/2019 1156   CALCIUM 10.0 07/06/2017 1000   CALCIUM 10.9 (H) 07/06/2016 1113   ALKPHOS 49 02/24/2019 1156   ALKPHOS 48 07/06/2017 1000   ALKPHOS 73 07/06/2016 1113   AST 33 02/24/2019 1156   AST 31 07/06/2016 1113   ALT 25 02/24/2019 1156   ALT 35 07/06/2017 1000   ALT 23 07/06/2016 1113   BILITOT 0.5 02/24/2019 1156   BILITOT 0.35 07/06/2016 1113         Impression and Plan: Ms. Peaden is 84 year old white female with a history of stage IIb lobular carcinoma the left breast. She was diagnosed back in 1998. She underwent systemic chemotherapy followed by Arimidex. She had radiation therapy.  She  completed Arimidex when 2008.  I think that from a standpoint of breast cancer, everything looks fine without any evidence of recurrence. It is now been 22 years.  At this point, I think we probably get her back in 1 year.  She like to  come back to see [place] as she feels confident about our exams and that we do lab work on her.  [name], MD 6/8/20201:05 PM

## 2019-04-08 DIAGNOSIS — Z961 Presence of intraocular lens: Secondary | ICD-10-CM | POA: Diagnosis not present

## 2019-04-08 DIAGNOSIS — E119 Type 2 diabetes mellitus without complications: Secondary | ICD-10-CM | POA: Diagnosis not present

## 2019-04-10 ENCOUNTER — Other Ambulatory Visit: Payer: Self-pay

## 2019-04-10 ENCOUNTER — Ambulatory Visit
Admission: RE | Admit: 2019-04-10 | Discharge: 2019-04-10 | Disposition: A | Discharge status: Home / Self Care | Payer: Medicare Other | Source: Ambulatory Visit | Attending: Hematology & Oncology | Admitting: Hematology & Oncology

## 2019-04-10 DIAGNOSIS — Z1231 Encounter for screening mammogram for malignant neoplasm of breast: Secondary | ICD-10-CM | POA: Diagnosis not present

## 2019-04-10 HISTORY — DX: Personal history of irradiation: Z92.3

## 2019-04-10 HISTORY — DX: Personal history of antineoplastic chemotherapy: Z92.21

## 2019-05-15 DIAGNOSIS — E119 Type 2 diabetes mellitus without complications: Secondary | ICD-10-CM | POA: Diagnosis not present

## 2019-07-29 DIAGNOSIS — E119 Type 2 diabetes mellitus without complications: Secondary | ICD-10-CM | POA: Diagnosis not present

## 2019-08-11 DIAGNOSIS — C44311 Basal cell carcinoma of skin of nose: Secondary | ICD-10-CM | POA: Diagnosis not present

## 2019-08-11 DIAGNOSIS — Z85828 Personal history of other malignant neoplasm of skin: Secondary | ICD-10-CM | POA: Diagnosis not present

## 2019-08-11 DIAGNOSIS — L821 Other seborrheic keratosis: Secondary | ICD-10-CM | POA: Diagnosis not present

## 2019-08-11 DIAGNOSIS — D485 Neoplasm of uncertain behavior of skin: Secondary | ICD-10-CM | POA: Diagnosis not present

## 2019-08-12 DIAGNOSIS — M859 Disorder of bone density and structure, unspecified: Secondary | ICD-10-CM | POA: Diagnosis not present

## 2019-08-12 DIAGNOSIS — E119 Type 2 diabetes mellitus without complications: Secondary | ICD-10-CM | POA: Diagnosis not present

## 2019-08-12 DIAGNOSIS — E538 Deficiency of other specified B group vitamins: Secondary | ICD-10-CM | POA: Diagnosis not present

## 2019-08-12 DIAGNOSIS — E038 Other specified hypothyroidism: Secondary | ICD-10-CM | POA: Diagnosis not present

## 2019-08-19 DIAGNOSIS — E1169 Type 2 diabetes mellitus with other specified complication: Secondary | ICD-10-CM | POA: Diagnosis not present

## 2019-08-19 DIAGNOSIS — C50919 Malignant neoplasm of unspecified site of unspecified female breast: Secondary | ICD-10-CM | POA: Diagnosis not present

## 2019-08-19 DIAGNOSIS — Z0000 Encounter for general adult medical examination without abnormal findings: Secondary | ICD-10-CM | POA: Diagnosis not present

## 2019-08-19 DIAGNOSIS — Z1331 Encounter for screening for depression: Secondary | ICD-10-CM | POA: Diagnosis not present

## 2019-08-19 DIAGNOSIS — R2689 Other abnormalities of gait and mobility: Secondary | ICD-10-CM | POA: Diagnosis not present

## 2019-08-27 ENCOUNTER — Other Ambulatory Visit: Payer: Self-pay | Admitting: Nurse Practitioner

## 2019-08-27 DIAGNOSIS — U071 COVID-19: Secondary | ICD-10-CM | POA: Diagnosis not present

## 2019-08-27 DIAGNOSIS — I10 Essential (primary) hypertension: Secondary | ICD-10-CM

## 2019-08-27 DIAGNOSIS — Z20818 Contact with and (suspected) exposure to other bacterial communicable diseases: Secondary | ICD-10-CM | POA: Diagnosis not present

## 2019-08-27 DIAGNOSIS — E1169 Type 2 diabetes mellitus with other specified complication: Secondary | ICD-10-CM | POA: Diagnosis not present

## 2019-08-27 DIAGNOSIS — R509 Fever, unspecified: Secondary | ICD-10-CM | POA: Diagnosis not present

## 2019-08-27 NOTE — Progress Notes (Signed)
  I connected by phone with Meghan Santos on 08/27/2019 at 4:02 PM to discuss the potential use of an new treatment for mild to moderate COVID-19 viral infection in non-hospitalized patients.  This patient is a 84 y.o. female that meets the FDA criteria for Emergency Use Authorization of bamlanivimab or casirivimab\imdevimab.  Has a (+) direct SARS-CoV-2 viral test result  Has mild or moderate COVID-19   Is ? 84 years of age and weighs ? 40 kg  Is NOT hospitalized due to COVID-19  Is NOT requiring oxygen therapy or requiring an increase in baseline oxygen flow rate due to COVID-19  Is within 10 days of symptom onset  Has at least one of the high risk factor(s) for progression to severe COVID-19 and/or hospitalization as defined in EUA.  Specific high risk criteria : >/= 82 yo Patient is being followed for the following: Patient Active Problem List   Diagnosis Date Noted  . Aortic stenosis 09/25/2017  . Osteoporosis due to aromatase inhibitor 07/06/2017  . IDA (iron deficiency anemia) 05/31/2016  . Chronic diastolic heart failure ([place]) 03/03/2015  . Abnormal nuclear cardiac imaging test 12/22/2014  . Exertional dyspnea 09/05/2014  . Hypercholesteremia 09/05/2014  . Essential hypertension 09/05/2014  . Mild obesity 09/05/2014    I have spoken and communicated the following to the patient or parent/caregiver:  1. FDA has authorized the emergency use of bamlanivimab for the treatment of mild to moderate COVID-19 in adults and pediatric patients with positive results of direct SARS-CoV-2 viral testing who are 69 years of age and older weighing at least 40 kg, and who are at high risk for progressing to severe COVID-19 and/or hospitalization.  2. The significant known and potential risks and benefits of bamlanivimab, and the extent to which such potential risks and benefits are unknown.  3. Information on available alternative treatments and the risks and benefits of those  alternatives, including clinical trials.  4. Patients treated with bamlanivimab should continue to self-isolate and use infection control measures (e.g., wear mask, isolate, social distance, avoid sharing personal items, clean and disinfect "high touch" surfaces, and frequent handwashing) according to CDC guidelines.   5. The patient or parent/caregiver has the option to accept or refuse bamlanivimab.  After reviewing this information with the patient, The patient agreed to proceed with receiving the infusion of bamlanivimab and will be provided a copy of the Fact sheet prior to receiving the infusion.Fenton Foy 08/27/2019 4:02 PM

## 2019-08-29 ENCOUNTER — Ambulatory Visit (HOSPITAL_COMMUNITY)
Admission: RE | Admit: 2019-08-29 | Discharge: 2019-08-29 | Disposition: A | Discharge status: Home / Self Care | Payer: Medicare Other | Source: Ambulatory Visit | Attending: Pulmonary Disease | Admitting: Pulmonary Disease

## 2019-08-29 DIAGNOSIS — Z23 Encounter for immunization: Secondary | ICD-10-CM | POA: Diagnosis not present

## 2019-08-29 DIAGNOSIS — U071 COVID-19: Secondary | ICD-10-CM | POA: Diagnosis not present

## 2019-08-29 DIAGNOSIS — I10 Essential (primary) hypertension: Secondary | ICD-10-CM | POA: Insufficient documentation

## 2019-08-29 MED ORDER — EPINEPHRINE 0.3 MG/0.3ML IJ SOAJ: 0.3000 mg | Freq: Once | INTRAMUSCULAR | Status: DC | PRN

## 2019-08-29 MED ORDER — SODIUM CHLORIDE 0.9 % IV SOLN
700.0000 mg | Freq: Once | INTRAVENOUS | Status: AC
  Administered 2019-08-29: 700 mg via INTRAVENOUS
  Filled 2019-08-29: qty 20

## 2019-08-29 MED ORDER — FAMOTIDINE IN NACL 20-0.9 MG/50ML-% IV SOLN: 20.0000 mg | Freq: Once | INTRAVENOUS | Status: DC | PRN

## 2019-08-29 MED ORDER — SODIUM CHLORIDE 0.9 % IV SOLN: INTRAVENOUS | Status: DC | PRN

## 2019-08-29 MED ORDER — METHYLPREDNISOLONE SODIUM SUCC 125 MG IJ SOLR: 125.0000 mg | Freq: Once | INTRAMUSCULAR | Status: DC | PRN

## 2019-08-29 MED ORDER — DIPHENHYDRAMINE HCL 50 MG/ML IJ SOLN: 50.0000 mg | Freq: Once | INTRAMUSCULAR | Status: DC | PRN

## 2019-08-29 MED ORDER — ALBUTEROL SULFATE HFA 108 (90 BASE) MCG/ACT IN AERS: 2.0000 | INHALATION_SPRAY | Freq: Once | RESPIRATORY_TRACT | Status: DC | PRN

## 2019-08-29 NOTE — Discharge Instructions (Signed)
COVID-19: How to Protect Yourself and Others °Know how it spreads °· There is currently no vaccine to prevent coronavirus disease 2019 (COVID-19). °· The best way to prevent illness is to avoid being exposed to this virus. °· The virus is thought to spread mainly from person-to-person. °? Between people who are in close contact with one another (within about 6 feet). °? Through respiratory droplets produced when an infected person coughs, sneezes or talks. °? These droplets can land in the mouths or noses of people who are nearby or possibly be inhaled into the lungs. °? Some recent studies have suggested that COVID-19 may be spread by people who are not showing symptoms. °Everyone should °Clean your hands often °· Wash your hands often with soap and water for at least 20 seconds especially after you have been in a public place, or after blowing your nose, coughing, or sneezing. °· If soap and water are not readily available, use a hand sanitizer that contains at least 60% alcohol. Cover all surfaces of your hands and rub them together until they feel dry. °· Avoid touching your eyes, nose, and mouth with unwashed hands. °Avoid close contact °· Stay home if you are sick. °· Avoid close contact with people who are sick. °· Put distance between yourself and other people. °? Remember that some people without symptoms may be able to spread virus. °? This is especially important for people who are at higher risk of getting very sick.www.cdc.gov/coronavirus/2019-ncov/need-extra-precautions/people-at-higher-risk.html °Cover your mouth and nose with a cloth face cover when around others °· You could spread COVID-19 to others even if you do not feel sick. °· Everyone should wear a cloth face cover when they have to go out in public, for example to the grocery store or to pick up other necessities. °? Cloth face coverings should not be placed on young children under age 2, anyone who has trouble breathing, or is unconscious,  incapacitated or otherwise unable to remove the mask without assistance. °· The cloth face cover is meant to protect other people in case you are infected. °· Do NOT use a facemask meant for a healthcare worker. °· Continue to keep about 6 feet between yourself and others. The cloth face cover is not a substitute for social distancing. °Cover coughs and sneezes °· If you are in a private setting and do not have on your cloth face covering, remember to always cover your mouth and nose with a tissue when you cough or sneeze or use the inside of your elbow. °· Throw used tissues in the trash. °· Immediately wash your hands with soap and water for at least 20 seconds. If soap and water are not readily available, clean your hands with a hand sanitizer that contains at least 60% alcohol. °Clean and disinfect °· Clean AND disinfect frequently touched surfaces daily. This includes tables, doorknobs, light switches, countertops, handles, desks, phones, keyboards, toilets, faucets, and sinks. www.cdc.gov/coronavirus/2019-ncov/prevent-getting-sick/disinfecting-your-home.html °· If surfaces are dirty, clean them: Use detergent or soap and water prior to disinfection. °· Then, use a household disinfectant. You can see a list of EPA-registered household disinfectants here. °cdc.gov/coronavirus °01/21/2019 °This information is not intended to replace advice given to you by your health care provider. Make sure you discuss any questions you have with your health care provider. °Document Released: 12/31/2018 Document Revised: 01/29/2019 Document Reviewed: 12/31/2018 °Elsevier Patient Education © 2020 [employer] °COVID-19 Frequently Asked Questions °COVID-19 (coronavirus disease) is an infection that is caused by a large family of   viruses. Some viruses cause illness in people and others cause illness in animals like camels, cats, and bats. In some cases, the viruses that cause illness in animals can spread to humans. °Where did the  coronavirus come from? °In December 2019, [place] told the World Health Organization (WHO) of several cases of lung disease (human respiratory illness). These cases were linked to an open seafood and livestock market in the city of [place]. The link to the seafood and livestock market suggests that the virus may have spread from animals to humans. However, since that first outbreak in December, the virus has also been shown to spread from person to person. °What is the name of the disease and the virus? °Disease name °Early on, this disease was called novel coronavirus. This is because scientists determined that the disease was caused by a new (novel) respiratory virus. The World Health Organization (WHO) has now named the disease COVID-19, or coronavirus disease. °Virus name °The virus that causes the disease is called severe acute respiratory syndrome coronavirus 2 (SARS-CoV-2). °More information on disease and virus naming °World Health Organization (WHO): www.who.int/emergencies/diseases/novel-coronavirus-2019/technical-guidance/naming-the-coronavirus-disease-(covid-2019)-and-the-virus-that-causes-it °Who is at risk for complications from coronavirus disease? °Some people may be at higher risk for complications from coronavirus disease. This includes older adults and people who have chronic diseases, such as heart disease, diabetes, and lung disease. °If you are at higher risk for complications, take these extra precautions: °· Avoid close contact with people who are sick or have a fever or cough. Stay at least 3-6 ft (1-2 m) away from them, if possible. °· Wash your hands often with soap and water for at least 20 seconds. °· Avoid touching your face, mouth, nose, or eyes. °· Keep supplies on hand at home, such as food, medicine, and cleaning supplies. °· Stay home as much as possible. °· Avoid social gatherings and travel. °How does coronavirus disease spread? °The virus that causes coronavirus disease spreads  easily from person to person (is contagious). There are also cases of community-spread disease. This means the disease has spread to: °· People who have no known contact with other infected people. °· People who have not traveled to areas where there are known cases. °It appears to spread from one person to another through droplets from coughing or sneezing. °Can I get the virus from touching surfaces or objects? °There is still a lot that we do not know about the virus that causes coronavirus disease. Scientists are basing a lot of information on what they know about similar viruses, such as: °· Viruses cannot generally survive on surfaces for long. They need a human body (host) to survive. °· It is more likely that the virus is spread by close contact with people who are sick (direct contact), such as through: °? Shaking hands or hugging. °? Breathing in respiratory droplets that travel through the air. This can happen when an infected person coughs or sneezes on or near other people. °· It is less likely that the virus is spread when a person touches a surface or object that has the virus on it (indirect contact). The virus may be able to enter the body if the person touches a surface or object and then touches his or her face, eyes, nose, or mouth. °Can a person spread the virus without having symptoms of the disease? °It may be possible for the virus to spread before a person has symptoms of the disease, but this is most likely not the main way the virus is   spreading. It is more likely for the virus to spread by being in close contact with people who are sick and breathing in the respiratory droplets of a sick person's cough or sneeze. °What are the symptoms of coronavirus disease? °Symptoms vary from person to person and can range from mild to severe. Symptoms may include: °· Fever. °· Cough. °· Tiredness, weakness, or fatigue. °· Fast breathing or feeling short of breath. °These symptoms can appear anywhere  from 2 to 14 days after you have been exposed to the virus. If you develop symptoms, call your health care provider. People with severe symptoms may need hospital care. °If I am exposed to the virus, how long does it take before symptoms start? °Symptoms of coronavirus disease may appear anywhere from 2 to 14 days after a person has been exposed to the virus. If you develop symptoms, call your health care provider. °Should I be tested for this virus? °Your health care provider will decide whether to test you based on your symptoms, history of exposure, and your risk factors. °How does a health care provider test for this virus? °Health care providers will collect samples to send for testing. Samples may include: °· Taking a swab of fluid from the nose. °· Taking fluid from the lungs by having you cough up mucus (sputum) into a sterile cup. °· Taking a blood sample. °· Taking a stool or urine sample. °Is there a treatment or vaccine for this virus? °Currently, there is no vaccine to prevent coronavirus disease. Also, there are no medicines like antibiotics or antivirals to treat the virus. A person who becomes sick is given supportive care, which means rest and fluids. A person may also relieve his or her symptoms by using over-the-counter medicines that treat sneezing, coughing, and runny nose. These are the same medicines that a person takes for the common cold. °If you develop symptoms, call your health care provider. People with severe symptoms may need hospital care. °What can I do to protect myself and my family from this virus? ° °  ° °You can protect yourself and your family by taking the same actions that you would take to prevent the spread of other viruses. Take the following actions: °· Wash your hands often with soap and water for at least 20 seconds. If soap and water are not available, use alcohol-based hand sanitizer. °· Avoid touching your face, mouth, nose, or eyes. °· Cough or sneeze into a tissue,  sleeve, or elbow. Do not cough or sneeze into your hand or the air. °? If you cough or sneeze into a tissue, throw it away immediately and wash your hands. °· Disinfect objects and surfaces that you frequently touch every day. °· Avoid close contact with people who are sick or have a fever or cough. Stay at least 3-6 ft (1-2 m) away from them, if possible. °· Stay home if you are sick, except to get medical care. Call your health care provider before you get medical care. °· Make sure your vaccines are up to date. Ask your health care provider what vaccines you need. °What should I do if I need to travel? °Follow travel recommendations from your local health authority, the CDC, and WHO. °Travel information and advice °· Centers for Disease Control and Prevention (CDC): www.cdc.gov/coronavirus/2019-ncov/travelers/index.html °· World Health Organization (WHO): www.who.int/emergencies/diseases/novel-coronavirus-2019/travel-advice °Know the risks and take action to protect your health °· You are at higher risk of getting coronavirus disease if you are traveling to areas with   an outbreak or if you are exposed to travelers from areas with an outbreak. °· Wash your hands often and practice good hygiene to lower the risk of catching or spreading the virus. °What should I do if I am sick? °General instructions to stop the spread of infection °· Wash your hands often with soap and water for at least 20 seconds. If soap and water are not available, use alcohol-based hand sanitizer. °· Cough or sneeze into a tissue, sleeve, or elbow. Do not cough or sneeze into your hand or the air. °· If you cough or sneeze into a tissue, throw it away immediately and wash your hands. °· Stay home unless you must get medical care. Call your health care provider or local health authority before you get medical care. °· Avoid public areas. Do not take public transportation, if possible. °· If you can, wear a mask if you must go out of the house  or if you are in close contact with someone who is not sick. °Keep your home clean °· Disinfect objects and surfaces that are frequently touched every day. This may include: °? Counters and tables. °? Doorknobs and light switches. °? Sinks and faucets. °? Electronics such as phones, remote controls, keyboards, computers, and tablets. °· Wash dishes in hot, soapy water or use a dishwasher. Air-dry your dishes. °· Wash laundry in hot water. °Prevent infecting other household members °· Let healthy household members care for children and pets, if possible. If you have to care for children or pets, wash your hands often and wear a mask. °· Sleep in a different bedroom or bed, if possible. °· Do not share personal items, such as razors, toothbrushes, deodorant, combs, brushes, towels, and washcloths. °Where to find more information °Centers for Disease Control and Prevention (CDC) °· Information and news updates: www.cdc.gov/coronavirus/2019-ncov °World Health Organization (WHO) °· Information and news updates: www.who.int/emergencies/diseases/novel-coronavirus-2019 °· Coronavirus health topic: www.who.int/health-topics/coronavirus °· Questions and answers on COVID-19: www.who.int/news-room/q-a-detail/q-a-coronaviruses °· Global tracker: who.sprinklr.com °American Academy of Pediatrics (AAP) °· Information for families: www.healthychildren.org/English/health-issues/conditions/chest-lungs/Pages/2019-Novel-Coronavirus.aspx °The coronavirus situation is changing rapidly. Check your local health authority website or the CDC and WHO websites for updates and news. °When should I contact a health care provider? °· Contact your health care provider if you have symptoms of an infection, such as fever or cough, and you: °? Have been near anyone who is known to have coronavirus disease. °? Have come into contact with a person who is suspected to have coronavirus disease. °? Have traveled outside of the country. °When should I get  emergency medical care? °· Get help right away by calling your local emergency services (911 in the [place] if you have: °? Trouble breathing. °? Pain or pressure in your chest. °? Confusion. °? Blue-tinged lips and fingernails. °? Difficulty waking from sleep. °? Symptoms that get worse. °Let the emergency medical personnel know if you think you have coronavirus disease. °Summary °· A new respiratory virus is spreading from person to person and causing COVID-19 (coronavirus disease). °· The virus that causes COVID-19 appears to spread easily. It spreads from one person to another through droplets from coughing or sneezing. °· Older adults and those with chronic diseases are at higher risk of disease. If you are at higher risk for complications, take extra precautions. °· There is currently no vaccine to prevent coronavirus disease. There are no medicines, such as antibiotics or antivirals, to treat the virus. °· You can protect yourself and your family by washing your   hands often, avoiding touching your face, and covering your coughs and sneezes. °This information is not intended to replace advice given to you by your health care provider. Make sure you discuss any questions you have with your health care provider. °Document Released: 12/31/2018 Document Revised: 12/31/2018 Document Reviewed: 12/31/2018 °Elsevier Patient Education © 2020 [employer]. ° °

## 2019-08-29 NOTE — Progress Notes (Signed)
  Diagnosis: COVID-19  Physician: Dr. Joya Gaskins  Procedure: [facility] Med: bamlanivimab infusion - Provided patient with bamlanimivab fact sheet for patients, parents and caregivers prior to infusion.  Complications: No immediate complications noted.  Discharge: Discharged home   Rayburn Mundis, Cleaster Corin 08/29/2019

## 2019-10-30 ENCOUNTER — Encounter: Payer: Self-pay | Admitting: Cardiovascular Disease

## 2019-10-30 ENCOUNTER — Other Ambulatory Visit: Payer: Self-pay

## 2019-10-30 ENCOUNTER — Ambulatory Visit: Payer: Medicare Other | Admitting: Cardiovascular Disease

## 2019-10-30 VITALS — BP 112/72 | HR 67 | Temp 97.0°F | Ht 66.5 in | Wt 171.2 lb

## 2019-10-30 DIAGNOSIS — I10 Essential (primary) hypertension: Secondary | ICD-10-CM | POA: Diagnosis not present

## 2019-10-30 DIAGNOSIS — I350 Nonrheumatic aortic (valve) stenosis: Secondary | ICD-10-CM | POA: Diagnosis not present

## 2019-10-30 DIAGNOSIS — R931 Abnormal findings on diagnostic imaging of heart and coronary circulation: Secondary | ICD-10-CM

## 2019-10-30 DIAGNOSIS — E782 Mixed hyperlipidemia: Secondary | ICD-10-CM

## 2019-10-30 DIAGNOSIS — I5032 Chronic diastolic (congestive) heart failure: Secondary | ICD-10-CM | POA: Diagnosis not present

## 2019-10-30 DIAGNOSIS — D509 Iron deficiency anemia, unspecified: Secondary | ICD-10-CM

## 2019-10-30 DIAGNOSIS — G479 Sleep disorder, unspecified: Secondary | ICD-10-CM

## 2019-10-30 DIAGNOSIS — E119 Type 2 diabetes mellitus without complications: Secondary | ICD-10-CM

## 2019-10-30 MED ORDER — TRAZODONE HCL 50 MG PO TABS: 50.0000 mg | ORAL_TABLET | Freq: Every day | ORAL | 0 refills | Status: DC

## 2019-10-30 NOTE — Patient Instructions (Signed)
Medication Instructions:  TAKE Trazodone 50 mg at bedtime  *If you need a refill on your cardiac medications before your next appointment, please call your pharmacy*  Lab Work: None ordered If you have labs (blood work) drawn today and your tests are completely normal, you will receive your results only by: Marland Kitchen MyChart Message (if you have MyChart) OR . A paper copy in the mail If you have any lab test that is abnormal or we need to change your treatment, we will call you to review the results.  Testing/Procedures: None ordered  Follow-Up: At [facility], you and your health needs are our priority.  As part of our continuing mission to provide you with exceptional heart care, we have created designated Provider Care Teams.  These Care Teams include your primary Cardiologist (physician) and Advanced Practice Providers (APPs -  Physician Assistants and Nurse Practitioners) who all work together to provide you with the care you need, when you need it.  Your next appointment:   12 month(s)  The format for your next appointment:   In Person  Provider:   Sanda Klein, MD

## 2019-10-30 NOTE — Progress Notes (Signed)
Cardiology Office Note    Date:  10/30/2019   ID:  Meghan Santos, DOB 1935-07-10, MRN [id number]  PCP:  Crist Infante, MD  Cardiologist:   Sanda Klein, MD   Chief Complaint  Patient presents with  . Congestive Heart Failure    History of Present Illness:  Meghan Santos is a 85 y.o. female with hypertension, mild aortic valve stenosis and well compensated diastolic heart failure as well as hyperlipidemia and chronic iron deficiency anemia.   Overall she has had a good year from the health point of view, definitely without any cardiovascular issues.  The patient specifically denies any chest pain at rest exertion, dyspnea at rest or with exertion, orthopnea, paroxysmal nocturnal dyspnea, syncope, palpitations, focal neurological deficits, intermittent claudication, lower extremity edema, unexplained weight gain, cough, hemoptysis or wheezing.  On the other hand, she is having a lot of trouble sleeping.  She will wake up with panic attacks and generally has difficulty resting.  The symptoms may have begun after her sister-in-law died in her sleep on her 80th birthday.  It got worse after her husband was hospitalized with GI bleeding leading to several other complications.  She is now worried that she needs to monitor him as well.  In the past she used to take benzodiazepines but has weaned herself off of these.  She still has a handful of them left and will take 1 occasionally.  Dr. Joylene Draft prescribed mirtazapine but she did not tolerate this due to severe constipation.  2015 echo shows borderline LVEF [id number], grade 2 diastolic dysfunction.  2015 nuclear stress test has shown a very small region of apical ischemia. She has never undergone coronary angiography and she does not have angina pectoris. Note a remote history of chest radiation therapy and chemotherapy with Adriamycin for breast cancer in 1998.  Past Medical History:  Diagnosis Date  . Breast cancer ([place]) 1998   left  breast with radiation and chemo  . Diabetes ([place])   . Essential hypertension 09/05/2014  . Hypercholesteremia 09/05/2014  . Hypercholesterolemia   . Hypertension   . Mild obesity 09/05/2014  . Personal history of chemotherapy   . Personal history of radiation therapy     Past Surgical History:  Procedure Laterality Date  . BREAST LUMPECTOMY Left 11/1996  . TONSILLECTOMY  1958  . VAGINAL HYSTERECTOMY  1983    Current Medications: Outpatient Medications Prior to Visit  Medication Sig Dispense Refill  . aspirin 81 MG tablet Take 81 mg by mouth.    . benazepril-hydrochlorthiazide (LOTENSIN HCT) 10-12.5 MG per tablet daily.     . Calcium Carbonate (CALCIUM 600 PO) Take 1 tablet by mouth daily.    . Cholecalciferol (VITAMIN D) 2000 UNITS tablet Take 2,000 Units by mouth daily.    . Coenzyme Q10 (CO Q-10) 100 MG CAPS Take 300 mg by mouth every morning.     . Cyanocobalamin (VITAMIN B 12 PO) Take by mouth every morning.    . furosemide (LASIX) 40 MG tablet Take 2 tablets (80 mg total) by mouth daily. 180 tablet 3  . levothyroxine (SYNTHROID, LEVOTHROID) 125 MCG tablet 125 mcg daily.     . metFORMIN (GLUCOPHAGE) 1000 MG tablet TK 1 T PO D AT SUPPER TIME  0  . Multiple Vitamin (MULTI-VITAMIN DAILY PO) Take by mouth every morning.    . potassium chloride SA (K-DUR,KLOR-CON) 20 MEQ tablet TAKE 1 TABLET(20 MEQ) BY MOUTH DAILY 90 tablet 3  . rosuvastatin (CRESTOR) 10 MG  tablet 10 mg daily.     . sitaGLIPtin (JANUVIA) 100 MG tablet Take 100 mg by mouth daily. Unknown dose     . diazepam (VALIUM) 10 MG tablet Take 10 mg by mouth at bedtime as needed.    . mirtazapine (REMERON) 15 MG tablet Take 15 mg by mouth at bedtime.     No facility-administered medications prior to visit.     Allergies:   Patient has no known allergies.   Social History   Socioeconomic History  . Marital status: Married    Spouse name: Not on file  . Number of children: Not on file  . Years of education: Not on  file  . Highest education level: Not on file  Occupational History  . Not on file  Tobacco Use  . Smoking status: Never Smoker  . Smokeless tobacco: Never Used  . Tobacco comment: never used tobacco  Substance and Sexual Activity  . Alcohol use: Yes    Alcohol/week: 0.0 standard drinks    Comment: occas.  . Drug use: No  . Sexual activity: Not on file  Other Topics Concern  . Not on file  Social History Narrative  . Not on file   Social Determinants of Health   Financial Resource Strain:   . Difficulty of Paying Living Expenses: Not on file  Food Insecurity:   . Worried About Charity fundraiser in the Last Year: Not on file  . Ran Out of Food in the Last Year: Not on file  Transportation Needs:   . Lack of Transportation (Medical): Not on file  . Lack of Transportation (Non-Medical): Not on file  Physical Activity:   . Days of Exercise per Week: Not on file  . Minutes of Exercise per Session: Not on file  Stress:   . Feeling of Stress : Not on file  Social Connections:   . Frequency of Communication with Friends and Family: Not on file  . Frequency of Social Gatherings with Friends and Family: Not on file  . Attends Religious Services: Not on file  . Active Member of Clubs or Organizations: Not on file  . Attends Archivist Meetings: Not on file  . Marital Status: Not on file     Family History:  The patient's family history includes Alcoholism in her brother; Cancer in her sister; Dementia in her father; Diabetes in her brother; Heart attack in her mother.   ROS:   Please see the history of present illness.    ROS All other systems are reviewed and are negative.   PHYSICAL EXAM:   VS:  BP 112/72   Pulse 67   Temp (!) 97 F (36.1 C)   Ht 5' 6.5" (1.689 m)   Wt 171 lb 3.2 oz (77.7 kg)   SpO2 97%   BMI 27.22 kg/m      General: Alert, oriented x3, no distress, lean, not pale Head: no evidence of trauma, PERRL, EOMI, no exophtalmos or lid lag, no  myxedema, no xanthelasma; normal ears, nose and oropharynx Neck: normal jugular venous pulsations and no hepatojugular reflux; brisk carotid pulses without delay and no carotid bruits Chest: clear to auscultation, no signs of consolidation by percussion or palpation, normal fremitus, symmetrical and full respiratory excursions Cardiovascular: normal position and quality of the apical impulse, regular rhythm, normal first and second heart sounds, 1/6 early peaking aortic ejection murmur no diastolic murmurs, rubs or gallops Abdomen: no tenderness or distention, no masses by palpation, no  abnormal pulsatility or arterial bruits, normal bowel sounds, no hepatosplenomegaly Extremities: no clubbing, cyanosis or edema; 2+ radial, ulnar and brachial pulses bilaterally; 2+ right femoral, posterior tibial and dorsalis pedis pulses; 2+ left femoral, posterior tibial and dorsalis pedis pulses; no subclavian or femoral bruits Neurological: grossly nonfocal Psych: Normal mood and affect    Wt Readings from Last 3 Encounters:  10/30/19 171 lb 3.2 oz (77.7 kg)  02/24/19 172 lb 1.9 oz (78.1 kg)  10/10/18 179 lb 12.8 oz (81.6 kg)      Studies/Labs Reviewed:   EKG:  EKG is ordered today.  Shows sinus rhythm with first-degree AV block and left axis deviation not quite meeting criteria for left anterior fascicular block, normal QTC 429 ms  Recent Labs: 02/24/2019: ALT 25; BUN 18; Creatinine 1.01; Hemoglobin 13.2; Platelet Count 241; Potassium 4.5; Sodium 140  Lipid Panel  No results found for: CHOL, TRIG, HDL, CHOLHDL, VLDL, LDLCALC, LDLDIRECT, LABVLDL  08/12/2019 Cholesterol 163, HDL 45, LDL 77, triglycerides 202 Hemoglobin A1c 6% Hemoglobin 12.4, creatinine 0.8, potassium 4.5, normal liver function tests and TSH.  Additional studies/ records that were reviewed today include:   ASSESSMENT:    1. Chronic diastolic heart failure ([place])   2. Nonrheumatic aortic valve stenosis   3. Abnormal nuclear  cardiac imaging test   4. Hyperlipidemia, mixed   5. Iron deficiency anemia, unspecified iron deficiency anemia type   6. Essential hypertension   7. Diabetes mellitus type 2 in nonobese ([facility])   8. Sleep disturbance      PLAN:  In order of problems listed above:  1. CHF: NYHA functional class I, euvolemic, has not required diuretic dose adjustment.  Heart failure exacerbation was triggered by anemia in the past. 2. AS: Mild aortic stenosis, murmur was more prominent when she was anemic.  Asymptomatic. 3. Abn nuclear test: She does not have angina pectoris.  Coronary angiography is not indicated.  On aspirin and statin 4. HLP: On statin.  LDL very close to target at 77. 5. Iron deficiency anemia: Resolved.  No longer on iron supplements. 6. HTN: Excellent control. 7. DM: Excellent hemoglobin A1c. 8. Sleep: try trazodone 50 mg at bedtime. Will let Dr. Joylene Draft and her decide if this is appropriate long term.   Medication Adjustments/Labs and Tests Ordered: Current medicines are reviewed at length with the patient today.  Concerns regarding medicines are outlined above.  Medication changes, Labs and Tests ordered today are listed in the Patient Instructions below. Patient Instructions  Medication Instructions:  TAKE Trazodone 50 mg at bedtime  *If you need a refill on your cardiac medications before your next appointment, please call your pharmacy*  Lab Work: None ordered If you have labs (blood work) drawn today and your tests are completely normal, you will receive your results only by: Marland Kitchen MyChart Message (if you have MyChart) OR . A paper copy in the mail If you have any lab test that is abnormal or we need to change your treatment, we will call you to review the results.  Testing/Procedures: None ordered  Follow-Up: At [facility], you and your health needs are our priority.  As part of our continuing mission to provide you with exceptional heart care, we have created  designated Provider Care Teams.  These Care Teams include your primary Cardiologist (physician) and Advanced Practice Providers (APPs -  Physician Assistants and Nurse Practitioners) who all work together to provide you with the care you need, when you need it.  Your next  appointment:   12 month(s)  The format for your next appointment:   In Person  Provider:   Sanda Klein, MD       Signed, Sanda Klein, MD  10/30/2019 10:56 AM    [address] [place], [place], [place]  [address] Phone: [phone]; Fax: [phone]

## 2019-11-03 DIAGNOSIS — H01004 Unspecified blepharitis left upper eyelid: Secondary | ICD-10-CM | POA: Diagnosis not present

## 2019-11-03 DIAGNOSIS — H01001 Unspecified blepharitis right upper eyelid: Secondary | ICD-10-CM | POA: Diagnosis not present

## 2019-11-08 ENCOUNTER — Ambulatory Visit: Payer: Medicare Other | Attending: Internal Medicine

## 2019-11-08 DIAGNOSIS — Z23 Encounter for immunization: Secondary | ICD-10-CM | POA: Insufficient documentation

## 2019-11-08 NOTE — Progress Notes (Signed)
   Covid-19 Vaccination Clinic  Name:  Meghan Santos    MRN: [id number] DOB: [birth year]  11/08/2019  [name] was observed post Covid-19 immunization for 15 minutes without incidence. She was provided with Vaccine Information Sheet and instruction to access the V-Safe system.   [name] was instructed to call 911 with any severe reactions post vaccine: Marland Kitchen Difficulty breathing  . Swelling of your face and throat  . A fast heartbeat  . A bad rash all over your body  . Dizziness and weakness    Immunizations Administered    Name Date Dose VIS Date Route   Pfizer COVID-19 Vaccine 11/08/2019 12:55 PM 0.3 mL 08/29/2019 Intramuscular   Manufacturer: [place]   Lot: [id number]   [place]: [id number]

## 2019-11-24 ENCOUNTER — Other Ambulatory Visit: Payer: Self-pay | Admitting: Physician Assistant

## 2019-11-25 ENCOUNTER — Other Ambulatory Visit: Payer: Self-pay | Admitting: Cardiovascular Disease

## 2019-11-25 MED ORDER — FUROSEMIDE 40 MG PO TABS: 80.0000 mg | ORAL_TABLET | Freq: Every day | ORAL | 3 refills | Status: DC

## 2019-11-25 NOTE — Telephone Encounter (Signed)
*  STAT* If patient is at the pharmacy, call can be transferred to refill team.   1. Which medications need to be refilled? (please list name of each medication and dose if known)  furosemide (LASIX) 40 MG tablet  2. Which pharmacy/location (including street and city if local pharmacy) is medication to be sent to? [place], [place]  3. Do they need a 30 day or 90 day supply? [place]

## 2019-12-02 ENCOUNTER — Ambulatory Visit: Payer: Medicare Other | Attending: Internal Medicine

## 2019-12-02 DIAGNOSIS — Z23 Encounter for immunization: Secondary | ICD-10-CM

## 2019-12-02 NOTE — Progress Notes (Signed)
   Covid-19 Vaccination Clinic  Name:  Meghan Santos    MRN: [id number] DOB: Oct 16, 1934  12/02/2019  Meghan Santos was observed post Covid-19 immunization for 15 minutes without incident. She was provided with Vaccine Information Sheet and instruction to access the V-Safe system.   Meghan Santos was instructed to call 911 with any severe reactions post vaccine: Marland Kitchen Difficulty breathing  . Swelling of face and throat  . A fast heartbeat  . A bad rash all over body  . Dizziness and weakness   Immunizations Administered    Name Date Dose VIS Date Route   Pfizer COVID-19 Vaccine 12/02/2019 11:56 AM 0.3 mL 08/29/2019 Intramuscular   Manufacturer: [place]   Lot: [id number]   [place]: [id number]

## 2019-12-22 ENCOUNTER — Other Ambulatory Visit: Payer: Self-pay | Admitting: Physician Assistant

## 2019-12-23 NOTE — Telephone Encounter (Signed)
Rx request sent to pharmacy.  

## 2020-02-23 ENCOUNTER — Encounter: Payer: Self-pay | Admitting: Hematology & Oncology

## 2020-02-23 ENCOUNTER — Telehealth: Payer: Self-pay | Admitting: Hematology & Oncology

## 2020-02-23 ENCOUNTER — Inpatient Hospital Stay: Payer: Medicare Other

## 2020-02-23 ENCOUNTER — Inpatient Hospital Stay: Payer: Medicare Other | Attending: Hematology & Oncology | Admitting: Hematology & Oncology

## 2020-02-23 ENCOUNTER — Other Ambulatory Visit: Payer: Self-pay

## 2020-02-23 VITALS — BP 108/65 | HR 80 | Temp 96.6°F | Resp 19 | Wt 167.0 lb

## 2020-02-23 DIAGNOSIS — Z923 Personal history of irradiation: Secondary | ICD-10-CM | POA: Diagnosis not present

## 2020-02-23 DIAGNOSIS — M818 Other osteoporosis without current pathological fracture: Secondary | ICD-10-CM | POA: Insufficient documentation

## 2020-02-23 DIAGNOSIS — Z79811 Long term (current) use of aromatase inhibitors: Secondary | ICD-10-CM | POA: Insufficient documentation

## 2020-02-23 DIAGNOSIS — D509 Iron deficiency anemia, unspecified: Secondary | ICD-10-CM | POA: Insufficient documentation

## 2020-02-23 DIAGNOSIS — D500 Iron deficiency anemia secondary to blood loss (chronic): Secondary | ICD-10-CM

## 2020-02-23 DIAGNOSIS — Z79899 Other long term (current) drug therapy: Secondary | ICD-10-CM | POA: Diagnosis not present

## 2020-02-23 DIAGNOSIS — C50912 Malignant neoplasm of unspecified site of left female breast: Secondary | ICD-10-CM | POA: Insufficient documentation

## 2020-02-23 LAB — CMP (CANCER CENTER ONLY)
ALT: 20 U/L (ref 0–44)
AST: 27 U/L (ref 15–41)
Albumin: 4.4 g/dL (ref 3.5–5.0)
Alkaline Phosphatase: 50 U/L (ref 38–126)
Anion gap: 5 (ref 5–15)
BUN: 15 mg/dL (ref 8–23)
CO2: 34 mmol/L — ABNORMAL HIGH (ref 22–32)
Calcium: 10.5 mg/dL — ABNORMAL HIGH (ref 8.9–10.3)
Chloride: 100 mmol/L (ref 98–111)
Creatinine: 0.94 mg/dL (ref 0.44–1.00)
GFR, Est AFR Am: >60 mL/min (ref >60)
GFR, Estimated: 56 mL/min — ABNORMAL LOW (ref >60)
Glucose, Bld: 124 mg/dL — ABNORMAL HIGH (ref 70–99)
Potassium: 4.5 mmol/L (ref 3.5–5.1)
Sodium: 139 mmol/L (ref 135–145)
Total Bilirubin: 0.4 mg/dL (ref 0.3–1.2)
Total Protein: 7.1 g/dL (ref 6.5–8.1)

## 2020-02-23 LAB — CBC WITH DIFFERENTIAL (CANCER CENTER ONLY)
Abs Immature Granulocytes: 0.01 10*3/uL (ref 0.00–0.07)
Basophils Absolute: 0 10*3/uL (ref 0.0–0.1)
Basophils Relative: 1 %
Eosinophils Absolute: 0.1 10*3/uL (ref 0.0–0.5)
Eosinophils Relative: 2 %
HCT: 38.7 % (ref 36.0–46.0)
Hemoglobin: 12.6 g/dL (ref 12.0–15.0)
Immature Granulocytes: 0 %
Lymphocytes Relative: 41 %
Lymphs Abs: 2.3 10*3/uL (ref 0.7–4.0)
MCH: 30.4 pg (ref 26.0–34.0)
MCHC: 32.6 g/dL (ref 30.0–36.0)
MCV: 93.5 fL (ref 80.0–100.0)
Monocytes Absolute: 0.5 10*3/uL (ref 0.1–1.0)
Monocytes Relative: 9 %
Neutro Abs: 2.6 10*3/uL (ref 1.7–7.7)
Neutrophils Relative %: 47 %
Platelet Count: 204 10*3/uL (ref 150–400)
RBC: 4.14 MIL/uL (ref 3.87–5.11)
RDW: 11.9 % (ref 11.5–15.5)
WBC Count: 5.6 10*3/uL (ref 4.0–10.5)
nRBC: 0 % (ref 0.0–0.2)

## 2020-02-23 NOTE — Telephone Encounter (Signed)
Appointments scheduled calendar printed & mailed per 6/7 los

## 2020-02-23 NOTE — Progress Notes (Signed)
Hematology and Oncology Follow Up Visit  Meghan Santos [id number] November 16, 1934 85 y.o. 02/23/2020   Principle Diagnosis:  -Stage IIB (T2 N1 M0) lobular carcinoma of the left breast. 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  Meghan Santos is back for her follow-up. She is doing okay.  We are seeing her yearly.  She likes to come back to see [place].  She feels very confident that we give her a good examination and that she knows that if there is any problems we will help take care of her for her.  I told her that her family doctor, Dr. Abner Greenspan is always on top of things for her.  I know he does a fantastic job taking care over overall health.  She and her family were finally able to go to the beach.  They really had a good time.  She is expecting 1/5 great-grandchild in August.  She has had no problems with bleeding.  I would like to think that her iron levels should be okay.  She has had no problems with bowels or bladder.  There is been no leg swelling.  She has had no rashes.  She has been vaccinated for the coronavirus.  Overall, her performance status is ECOG 1.  Medications:  Current Outpatient Medications:  .  aspirin 81 MG tablet, Take 81 mg by mouth., Disp: , Rfl:  .  benazepril-hydrochlorthiazide (LOTENSIN HCT) 10-12.5 MG per tablet, daily. , Disp: , Rfl:  .  Calcium Carbonate (CALCIUM 600 PO), Take 1 tablet by mouth daily., Disp: , Rfl:  .  Cholecalciferol (VITAMIN D) 2000 UNITS tablet, Take 2,000 Units by mouth daily., Disp: , Rfl:  .  Coenzyme Q10 (CO Q-10) 100 MG CAPS, Take 300 mg by mouth every morning. , Disp: , Rfl:  .  Cyanocobalamin (VITAMIN B 12 PO), Take by mouth every morning., Disp: , Rfl:  .  furosemide (LASIX) 40 MG tablet, TAKE 2 TABLETS(80 MG) BY MOUTH DAILY, Disp: 180 tablet, Rfl: 3 .  levothyroxine (SYNTHROID, LEVOTHROID) 125 MCG tablet,  125 mcg daily. , Disp: , Rfl:  .  LORazepam (ATIVAN) 1 MG tablet, Take 1 mg by mouth at bedtime as needed., Disp: , Rfl:  .  metFORMIN (GLUCOPHAGE) 1000 MG tablet, TK 1 T PO D AT SUPPER TIME, Disp: , Rfl: 0 .  Multiple Vitamin (MULTI-VITAMIN DAILY PO), Take by mouth every morning., Disp: , Rfl:  .  potassium chloride SA (KLOR-CON) 20 MEQ tablet, TAKE 1 TABLET(20 MEQ) BY MOUTH DAILY, Disp: 90 tablet, Rfl: 3 .  rosuvastatin (CRESTOR) 10 MG tablet, 10 mg daily. , Disp: , Rfl:  .  sitaGLIPtin (JANUVIA) 100 MG tablet, Take 100 mg by mouth daily. Unknown dose , Disp: , Rfl:  .  traZODone (DESYREL) 50 MG tablet, Take 1 tablet (50 mg total) by mouth at bedtime., Disp: 30 tablet, Rfl: 0  Allergies: No Known Allergies  Past Medical History, Surgical history, Social history, and Family History were reviewed and updated.  Review of Systems: Review of Systems  Constitutional: Negative.   HENT: Negative.   Eyes: Negative.   Respiratory: Negative.   Cardiovascular: Negative.   Gastrointestinal: Negative.   Genitourinary: Negative.   Musculoskeletal: Negative.   Skin: Negative.   Neurological: Negative.   Endo/Heme/Allergies: Negative.   Psychiatric/Behavioral: Negative.      Physical Exam:  weight is 167  lb (75.8 kg). Her temporal temperature is 96.6 F (35.9 C) (abnormal). Her blood pressure is 108/65 and her pulse is 80. Her respiration is 19 and oxygen saturation is 100%.   Physical Exam Vitals reviewed.  Constitutional:      Comments: Breast exam shows right breast no masses edema or erythema.  There is no right axillary adenopathy.  Left breast shows healed lobectomy at the 5 5 position at the edge of the areola.  There is a little bit of firmness there is no distinct mass.  There is some slight contraction of the left breast.  She has a well-healed lymphadenectomy scar in the left upper quadrant.  There is no left axillary adenopathy.  HENT:     Head: Normocephalic and atraumatic.    Eyes:     Pupils: Pupils are equal, round, and reactive to light.  Cardiovascular:     Rate and Rhythm: Normal rate and regular rhythm.     Heart sounds: Normal heart sounds.  Pulmonary:     Effort: Pulmonary effort is normal.     Breath sounds: Normal breath sounds.  Abdominal:     General: Bowel sounds are normal.     Palpations: Abdomen is soft.  Musculoskeletal:        General: No tenderness or deformity. Normal range of motion.     Cervical back: Normal range of motion.  Lymphadenopathy:     Cervical: No cervical adenopathy.  Skin:    General: Skin is warm and dry.     Findings: No erythema or rash.  Neurological:     Mental Status: She is alert and oriented to person, place, and time.  Psychiatric:        Behavior: Behavior normal.        Thought Content: Thought content normal.        Judgment: Judgment normal.      Lab Results  Component Value Date   WBC 5.6 02/23/2020   HGB 12.6 02/23/2020   HCT 38.7 02/23/2020   MCV 93.5 02/23/2020   PLT 204 02/23/2020     Chemistry      Component Value Date/Time   NA 139 02/23/2020 1127   NA 143 07/06/2017 1000   NA 141 07/06/2016 1113   K 4.5 02/23/2020 1127   K 4.1 07/06/2017 1000   K 3.9 07/06/2016 1113   CL 100 02/23/2020 1127   CL 100 07/06/2017 1000   CO2 34 (H) 02/23/2020 1127   CO2 32 07/06/2017 1000   CO2 29 07/06/2016 1113   BUN 15 02/23/2020 1127   BUN 11 07/06/2017 1000   BUN 20.4 07/06/2016 1113   CREATININE 0.94 02/23/2020 1127   CREATININE 1.0 07/06/2017 1000   CREATININE 0.9 07/06/2016 1113      Component Value Date/Time   CALCIUM 10.5 (H) 02/23/2020 1127   CALCIUM 10.0 07/06/2017 1000   CALCIUM 10.9 (H) 07/06/2016 1113   ALKPHOS 50 02/23/2020 1127   ALKPHOS 48 07/06/2017 1000   ALKPHOS 73 07/06/2016 1113   AST 27 02/23/2020 1127   AST 31 07/06/2016 1113   ALT 20 02/23/2020 1127   ALT 35 07/06/2017 1000   ALT 23 07/06/2016 1113   BILITOT 0.4 02/23/2020 1127   BILITOT 0.35 07/06/2016  1113         Impression and Plan: [name] is 85 year old white female with a history of stage IIb lobular carcinoma the left breast. She was diagnosed back in 1998. She underwent systemic chemotherapy followed  by Arimidex. She had radiation therapy.  She completed Arimidex when 2008.  I think that from a standpoint of breast cancer, everything looks fine without any evidence of recurrence. It is now been 22 years.  At this point, I think we probably get her back in another  year.  She likes to come back to see [place] as she feels confident about our exams and that we do lab work on her.  [name], MD 6/7/202112:33 PM

## 2020-02-24 LAB — IRON AND TIBC
Iron: 80 ug/dL (ref 41–142)
Saturation Ratios: 23 % (ref 21–57)
TIBC: 351 ug/dL (ref 236–444)
UIBC: 271 ug/dL (ref 120–384)

## 2020-02-24 LAB — FERRITIN: Ferritin: 47 ng/mL (ref 11–307)

## 2020-03-25 DIAGNOSIS — K219 Gastro-esophageal reflux disease without esophagitis: Secondary | ICD-10-CM | POA: Diagnosis not present

## 2020-03-25 DIAGNOSIS — E1169 Type 2 diabetes mellitus with other specified complication: Secondary | ICD-10-CM | POA: Diagnosis not present

## 2020-03-25 DIAGNOSIS — E538 Deficiency of other specified B group vitamins: Secondary | ICD-10-CM | POA: Diagnosis not present

## 2020-03-25 DIAGNOSIS — I350 Nonrheumatic aortic (valve) stenosis: Secondary | ICD-10-CM | POA: Diagnosis not present

## 2020-03-25 DIAGNOSIS — E039 Hypothyroidism, unspecified: Secondary | ICD-10-CM | POA: Diagnosis not present

## 2020-05-17 DIAGNOSIS — Z961 Presence of intraocular lens: Secondary | ICD-10-CM | POA: Diagnosis not present

## 2020-05-17 DIAGNOSIS — E119 Type 2 diabetes mellitus without complications: Secondary | ICD-10-CM | POA: Diagnosis not present

## 2020-08-24 ENCOUNTER — Ambulatory Visit
Admission: RE | Admit: 2020-08-24 | Discharge: 2020-08-24 | Disposition: A | Discharge status: Home / Self Care | Payer: Medicare Other | Source: Ambulatory Visit | Attending: Hematology & Oncology | Admitting: Hematology & Oncology

## 2020-08-24 ENCOUNTER — Other Ambulatory Visit: Payer: Self-pay | Admitting: Hematology & Oncology

## 2020-08-24 ENCOUNTER — Other Ambulatory Visit: Payer: Self-pay

## 2020-08-24 DIAGNOSIS — Z1231 Encounter for screening mammogram for malignant neoplasm of breast: Secondary | ICD-10-CM | POA: Diagnosis not present

## 2020-08-24 DIAGNOSIS — M8580 Other specified disorders of bone density and structure, unspecified site: Secondary | ICD-10-CM | POA: Diagnosis not present

## 2020-08-24 DIAGNOSIS — E538 Deficiency of other specified B group vitamins: Secondary | ICD-10-CM | POA: Diagnosis not present

## 2020-08-24 DIAGNOSIS — E039 Hypothyroidism, unspecified: Secondary | ICD-10-CM | POA: Diagnosis not present

## 2020-08-24 DIAGNOSIS — M859 Disorder of bone density and structure, unspecified: Secondary | ICD-10-CM | POA: Diagnosis not present

## 2020-08-24 DIAGNOSIS — E785 Hyperlipidemia, unspecified: Secondary | ICD-10-CM | POA: Diagnosis not present

## 2020-08-24 DIAGNOSIS — E1169 Type 2 diabetes mellitus with other specified complication: Secondary | ICD-10-CM | POA: Diagnosis not present

## 2020-08-31 DIAGNOSIS — E039 Hypothyroidism, unspecified: Secondary | ICD-10-CM | POA: Diagnosis not present

## 2020-08-31 DIAGNOSIS — R82998 Other abnormal findings in urine: Secondary | ICD-10-CM | POA: Diagnosis not present

## 2020-08-31 DIAGNOSIS — E1169 Type 2 diabetes mellitus with other specified complication: Secondary | ICD-10-CM | POA: Diagnosis not present

## 2020-08-31 DIAGNOSIS — E538 Deficiency of other specified B group vitamins: Secondary | ICD-10-CM | POA: Diagnosis not present

## 2020-08-31 DIAGNOSIS — Z0000 Encounter for general adult medical examination without abnormal findings: Secondary | ICD-10-CM | POA: Diagnosis not present

## 2021-01-12 DIAGNOSIS — I10 Essential (primary) hypertension: Secondary | ICD-10-CM | POA: Diagnosis not present

## 2021-01-12 DIAGNOSIS — E039 Hypothyroidism, unspecified: Secondary | ICD-10-CM | POA: Diagnosis not present

## 2021-01-12 DIAGNOSIS — E538 Deficiency of other specified B group vitamins: Secondary | ICD-10-CM | POA: Diagnosis not present

## 2021-01-12 DIAGNOSIS — E1169 Type 2 diabetes mellitus with other specified complication: Secondary | ICD-10-CM | POA: Diagnosis not present

## 2021-02-18 IMAGING — MG DIGITAL SCREENING BILATERAL MAMMOGRAM WITH TOMO AND CAD
8 series · 8 of 24 positions shown · non-contrast
Comparison: Previous exam(s).

CLINICAL DATA: Screening.

EXAM:
DIGITAL SCREENING BILATERAL MAMMOGRAM WITH TOMO AND CAD

[R CC synth-2D]
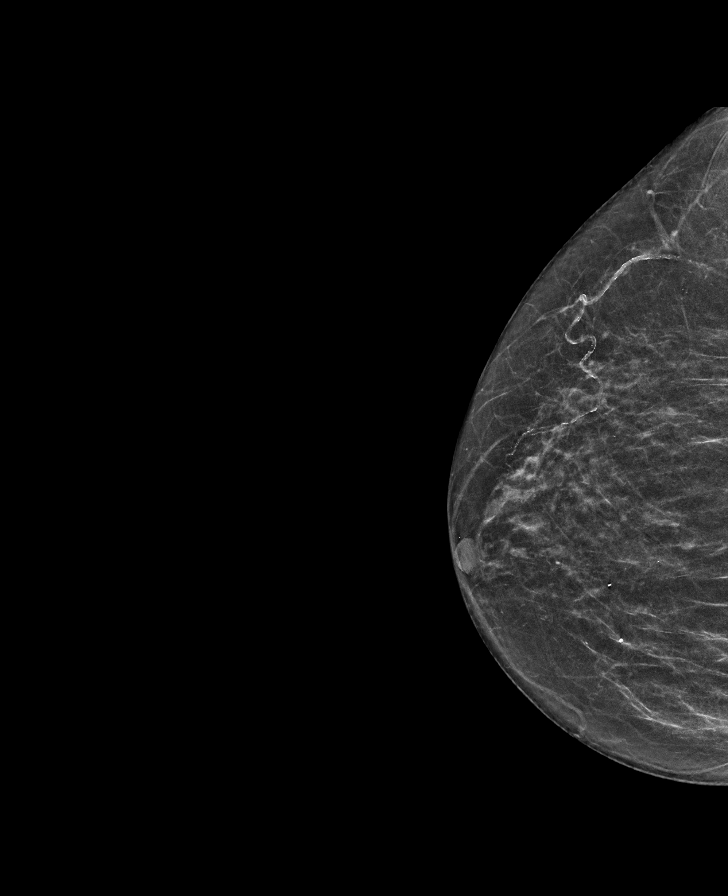

[L CC synth-2D]
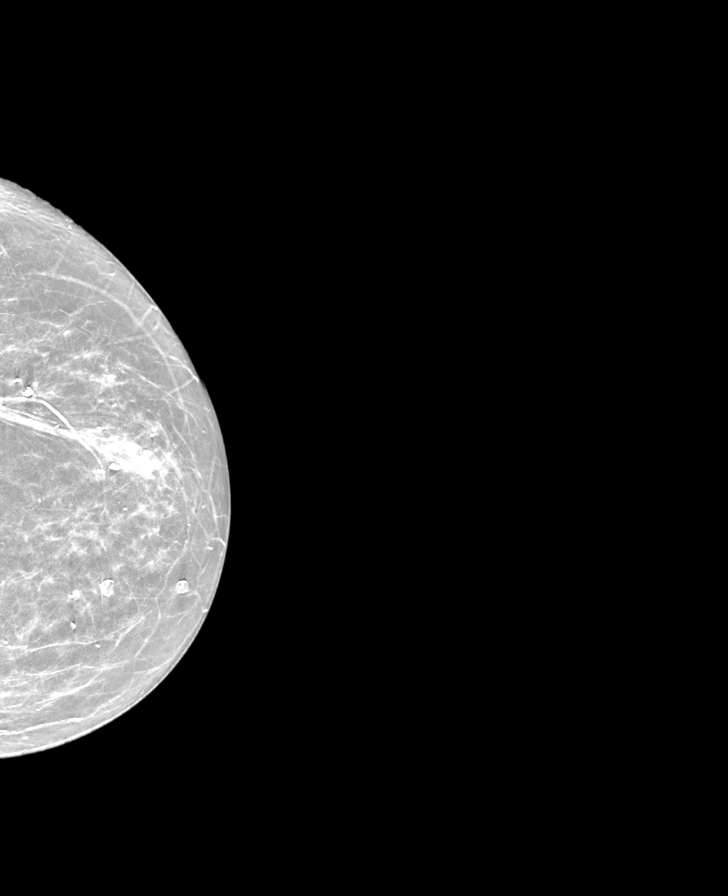

[R MLO synth-2D]
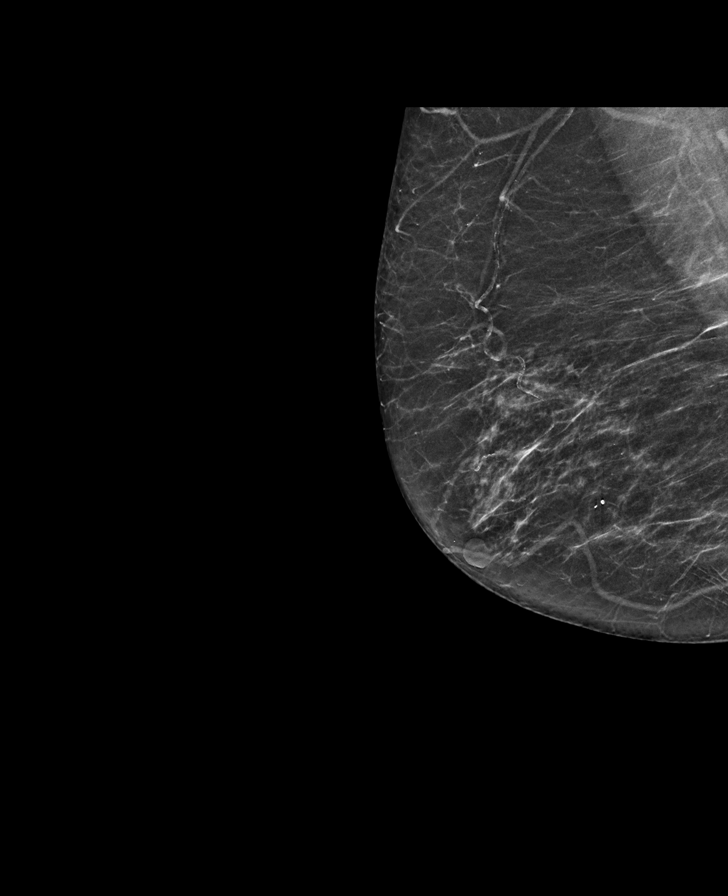

[L MLO synth-2D]
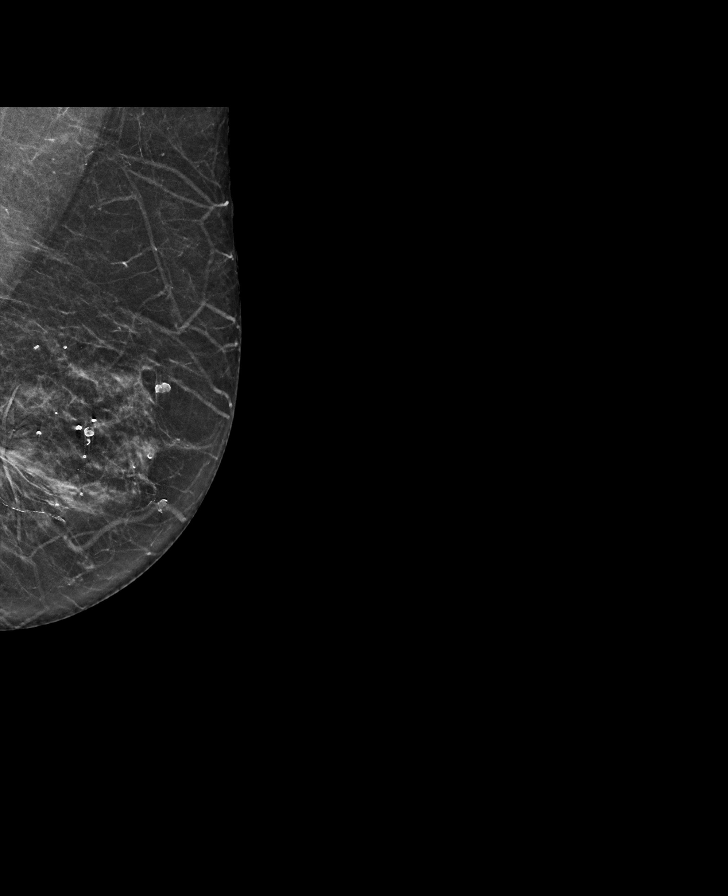

[L CC tomo · tomo slice 26/51.0]
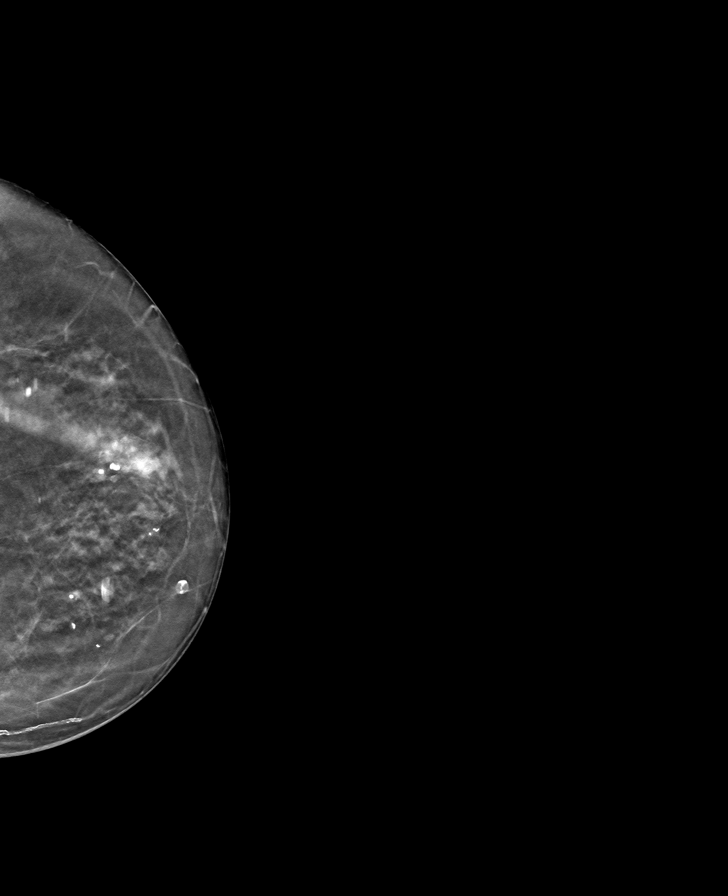

[R CC tomo · tomo slice 29/57.0]
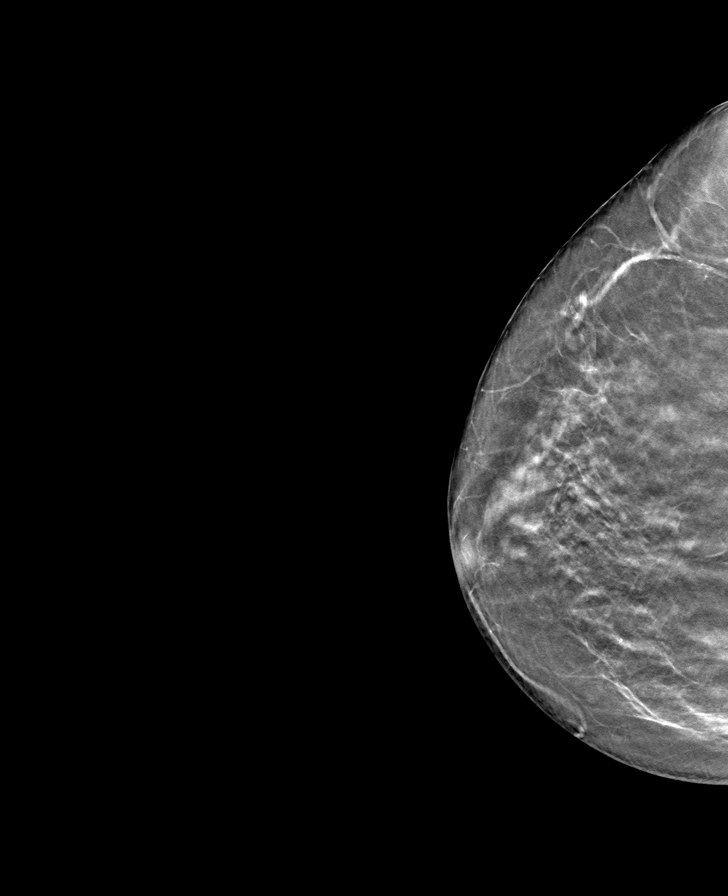

[R MLO tomo · tomo slice 30/59.0]
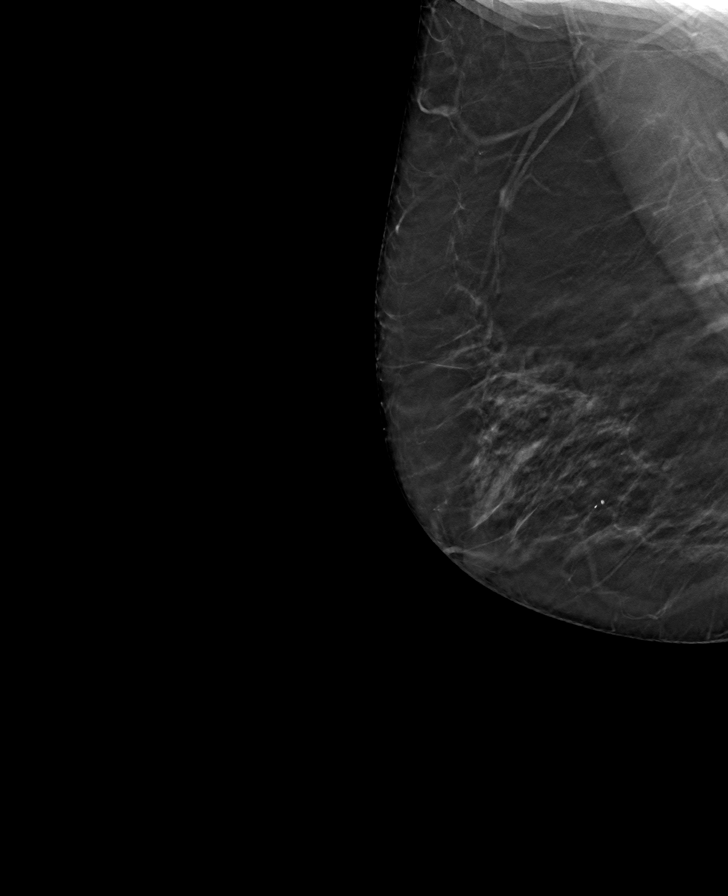

[L MLO tomo · tomo slice 28/55.0]
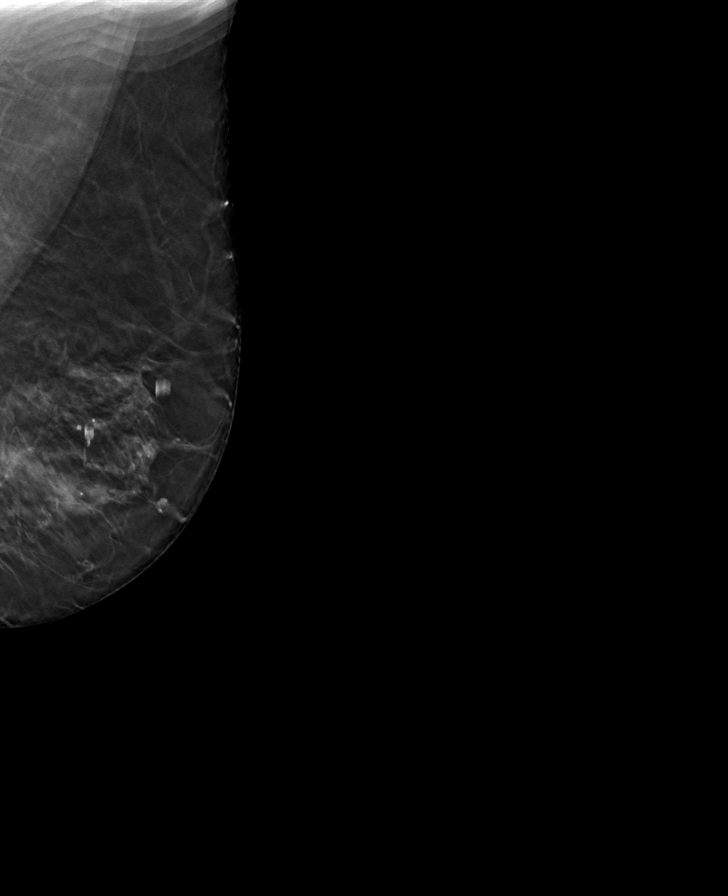

[8 of 24 positions shown; findings below may reference images not displayed]

ACR Breast Density Category b: There are scattered areas of
fibroglandular density.
FINDINGS: There are no findings suspicious for malignancy. Images were
processed with CAD.
IMPRESSION: No mammographic evidence of malignancy. A result letter of this
screening mammogram will be mailed directly to the patient.

RECOMMENDATION:
Screening mammogram in one year. (Code:[id number])

BI-RADS CATEGORY  1: Negative.

## 2021-02-22 ENCOUNTER — Inpatient Hospital Stay: Payer: Medicare Other | Admitting: Hematology & Oncology

## 2021-02-22 ENCOUNTER — Inpatient Hospital Stay: Payer: Medicare Other

## 2021-03-02 ENCOUNTER — Telehealth: Payer: Self-pay | Admitting: *Deleted

## 2021-03-02 NOTE — Telephone Encounter (Signed)
Patient called to cancel appointment. She stated since spouse passed away she doesn't get out much. I did offer [facility] transportation service which she said that she would think about it and would call back.

## 2021-03-02 NOTE — Telephone Encounter (Signed)
Per scheduling message [facility] - called and lvm of upcoming appointment

## 2021-03-17 ENCOUNTER — Ambulatory Visit: Payer: Medicare Other | Admitting: Hematology & Oncology

## 2021-03-17 ENCOUNTER — Other Ambulatory Visit: Payer: Medicare Other

## 2021-05-17 DIAGNOSIS — E119 Type 2 diabetes mellitus without complications: Secondary | ICD-10-CM | POA: Diagnosis not present

## 2021-05-17 DIAGNOSIS — Z961 Presence of intraocular lens: Secondary | ICD-10-CM | POA: Diagnosis not present

## 2021-05-18 DIAGNOSIS — E785 Hyperlipidemia, unspecified: Secondary | ICD-10-CM | POA: Diagnosis not present

## 2021-05-18 DIAGNOSIS — E1169 Type 2 diabetes mellitus with other specified complication: Secondary | ICD-10-CM | POA: Diagnosis not present

## 2021-05-18 DIAGNOSIS — D649 Anemia, unspecified: Secondary | ICD-10-CM | POA: Diagnosis not present

## 2021-05-18 DIAGNOSIS — E039 Hypothyroidism, unspecified: Secondary | ICD-10-CM | POA: Diagnosis not present

## 2021-05-18 DIAGNOSIS — I350 Nonrheumatic aortic (valve) stenosis: Secondary | ICD-10-CM | POA: Diagnosis not present

## 2021-05-19 ENCOUNTER — Telehealth: Payer: Self-pay | Admitting: *Deleted

## 2021-05-19 NOTE — Telephone Encounter (Signed)
Received call from patient stating that she saw Dr Joylene Draft and he noticed a sore that he thought needed to be evaluated by Dermatology.  She asked if Dr Marin Olp can treat this.  Talked with Dr Marin Olp who states that patient should follow up with dermatology and have biopsied and if he is needed they will contact him.  Patient appreciates this information

## 2021-05-24 DIAGNOSIS — C44729 Squamous cell carcinoma of skin of left lower limb, including hip: Secondary | ICD-10-CM | POA: Diagnosis not present

## 2021-05-24 DIAGNOSIS — D485 Neoplasm of uncertain behavior of skin: Secondary | ICD-10-CM | POA: Diagnosis not present

## 2021-05-24 DIAGNOSIS — Z85828 Personal history of other malignant neoplasm of skin: Secondary | ICD-10-CM | POA: Diagnosis not present

## 2021-06-29 DIAGNOSIS — C44729 Squamous cell carcinoma of skin of left lower limb, including hip: Secondary | ICD-10-CM | POA: Diagnosis not present

## 2021-06-29 DIAGNOSIS — Z85828 Personal history of other malignant neoplasm of skin: Secondary | ICD-10-CM | POA: Diagnosis not present

## 2021-07-06 ENCOUNTER — Ambulatory Visit: Payer: Medicare Other

## 2021-07-06 ENCOUNTER — Other Ambulatory Visit: Payer: Self-pay

## 2021-07-06 ENCOUNTER — Ambulatory Visit: Payer: Medicare Other | Admitting: Cardiovascular Disease

## 2021-07-06 ENCOUNTER — Encounter: Payer: Self-pay | Admitting: Cardiovascular Disease

## 2021-07-06 VITALS — BP 112/72 | HR 61 | Ht 66.5 in | Wt 166.6 lb

## 2021-07-06 DIAGNOSIS — E782 Mixed hyperlipidemia: Secondary | ICD-10-CM | POA: Diagnosis not present

## 2021-07-06 DIAGNOSIS — R931 Abnormal findings on diagnostic imaging of heart and coronary circulation: Secondary | ICD-10-CM | POA: Diagnosis not present

## 2021-07-06 DIAGNOSIS — I350 Nonrheumatic aortic (valve) stenosis: Secondary | ICD-10-CM | POA: Diagnosis not present

## 2021-07-06 DIAGNOSIS — I10 Essential (primary) hypertension: Secondary | ICD-10-CM

## 2021-07-06 DIAGNOSIS — R002 Palpitations: Secondary | ICD-10-CM

## 2021-07-06 DIAGNOSIS — I5032 Chronic diastolic (congestive) heart failure: Secondary | ICD-10-CM

## 2021-07-06 DIAGNOSIS — E119 Type 2 diabetes mellitus without complications: Secondary | ICD-10-CM

## 2021-07-06 NOTE — Progress Notes (Signed)
Cardiology Office Note    Date:  07/07/2021   ID:  Meghan Santos, DOB March 30, 1935, MRN [id number]  PCP:  Crist Infante, MD  Cardiologist:   Sanda Klein, MD   Chief Complaint  Patient presents with   Palpitations          History of Present Illness:  Meghan Santos is a 86 y.o. female with hypertension, mild aortic valve stenosis and well compensated diastolic heart failure as well as hyperlipidemia and chronic iron deficiency anemia.   From a physical point of view she is generally had a good year, but she has had challenge adjusting with the death of her husband about a year ago (after 71 years of marriage).  She continues to live in the same old home.  Recently she has been having abrupt onset palpitations, sometimes these will wake her up in the middle of the night.  She feels that her heart is racing but it is beating regularly.  The termination of the palpitations also seems to be fairly abrupt.  They happen almost daily.  There is no noticeable trigger.  They definitely do not occur during exercise.  The patient specifically denies any chest pain at rest exertion, dyspnea at rest or with exertion, orthopnea, paroxysmal nocturnal dyspnea, syncope, focal neurological deficits, intermittent claudication, lower extremity edema, unexplained weight gain, cough, hemoptysis or wheezing.  2015 echo shows borderline LVEF 09-32%, grade 2 diastolic dysfunction.  2015 nuclear stress test has shown a very small region of apical ischemia. She has never undergone coronary angiography and she does not have angina pectoris. Note a remote history of chest radiation therapy and chemotherapy with Adriamycin for breast cancer in 1998.  Past Medical History:  Diagnosis Date   Breast cancer ([place]) 1998   left breast with radiation and chemo   Diabetes ([place])    Essential hypertension 09/05/2014   Hypercholesteremia 09/05/2014   Hypercholesterolemia    Hypertension    Mild obesity  09/05/2014   Personal history of chemotherapy    Personal history of radiation therapy     Past Surgical History:  Procedure Laterality Date   BREAST LUMPECTOMY Left 11/1996   TONSILLECTOMY  1958   VAGINAL HYSTERECTOMY  1983    Current Medications: Outpatient Medications Prior to Visit  Medication Sig Dispense Refill   aspirin 81 MG tablet Take 81 mg by mouth.     benazepril-hydrochlorthiazide (LOTENSIN HCT) 10-12.5 MG per tablet Take 1 tablet by mouth daily.     Calcium Carbonate (CALCIUM 600 PO) Take 1 tablet by mouth daily.     Cholecalciferol (VITAMIN D) 2000 UNITS tablet Take 2,000 Units by mouth daily.     Coenzyme Q10 (CO Q-10) 100 MG CAPS Take 300 mg by mouth every morning.      Cyanocobalamin (VITAMIN B 12 PO) Take 1,000 mg by mouth every morning.     FLUoxetine (PROZAC) 40 MG capsule Take 40 mg by mouth daily.     furosemide (LASIX) 40 MG tablet TAKE 2 TABLETS(80 MG) BY MOUTH DAILY 180 tablet 3   levothyroxine (SYNTHROID, LEVOTHROID) 125 MCG tablet 125 mcg daily.      metFORMIN (GLUCOPHAGE) 1000 MG tablet TK 1 T PO D AT SUPPER TIME  0   Multiple Vitamin (MULTI-VITAMIN DAILY PO) Take by mouth every morning.     mupirocin ointment (BACTROBAN) 2 % Apply topically 3 (three) times daily.     potassium chloride SA (KLOR-CON) 20 MEQ tablet TAKE 1 TABLET(20 MEQ) BY MOUTH  DAILY 90 tablet 3   rosuvastatin (CRESTOR) 10 MG tablet 10 mg daily.      sitaGLIPtin (JANUVIA) 100 MG tablet Take 100 mg by mouth daily. Unknown dose      LORazepam (ATIVAN) 1 MG tablet Take 1 mg by mouth at bedtime as needed. (Patient not taking: Reported on 07/06/2021)     traZODone (DESYREL) 50 MG tablet Take 1 tablet (50 mg total) by mouth at bedtime. (Patient not taking: Reported on 07/06/2021) 30 tablet 0   No facility-administered medications prior to visit.     Allergies:   Patient has no known allergies.   Social History   Socioeconomic History   Marital status: Married    Spouse name: Not on  file   Number of children: Not on file   Years of education: Not on file   Highest education level: Not on file  Occupational History   Not on file  Tobacco Use   Smoking status: Never   Smokeless tobacco: Never   Tobacco comments:    never used tobacco  Vaping Use   Vaping Use: Never used  Substance and Sexual Activity   Alcohol use: Yes    Alcohol/week: 0.0 standard drinks    Comment: occas.   Drug use: No   Sexual activity: Not on file  Other Topics Concern   Not on file  Social History Narrative   Not on file   Social Determinants of Health   Financial Resource Strain: Not on file  Food Insecurity: Not on file  Transportation Needs: Not on file  Physical Activity: Not on file  Stress: Not on file  Social Connections: Not on file     Family History:  The patient's family history includes Alcoholism in her brother; Cancer in her sister; Dementia in her father; Diabetes in her brother; Heart attack in her mother.   ROS:   Please see the history of present illness.    ROS All other systems are reviewed and are negative.   PHYSICAL EXAM:   VS:  BP 112/72 (BP Location: Right Arm, Patient Position: Sitting, Cuff Size: Normal)   Pulse 61   Ht 5' 6.5" (1.689 m)   Wt 166 lb 9.6 oz (75.6 kg)   SpO2 96%   BMI 26.49 kg/m      General: Alert, oriented x3, no distress, mildly overweight Head: no evidence of trauma, PERRL, EOMI, no exophtalmos or lid lag, no myxedema, no xanthelasma; normal ears, nose and oropharynx Neck: normal jugular venous pulsations and no hepatojugular reflux; brisk carotid pulses without delay and no carotid bruits Chest: clear to auscultation, no signs of consolidation by percussion or palpation, normal fremitus, symmetrical and full respiratory excursions Cardiovascular: normal position and quality of the apical impulse, regular rhythm, normal first and distinct second heart sounds, 2/6 early peaking systolic ejection murmur heard best at the right  upper sternal border, no diastolic murmurs, rubs or gallops Abdomen: no tenderness or distention, no masses by palpation, no abnormal pulsatility or arterial bruits, normal bowel sounds, no hepatosplenomegaly Extremities: no clubbing, cyanosis or edema; 2+ radial, ulnar and brachial pulses bilaterally; 2+ right femoral, posterior tibial and dorsalis pedis pulses; 2+ left femoral, posterior tibial and dorsalis pedis pulses; no subclavian or femoral bruits Neurological: grossly nonfocal Psych: Normal mood and affect     Wt Readings from Last 3 Encounters:  07/06/21 166 lb 9.6 oz (75.6 kg)  02/23/20 167 lb (75.8 kg)  10/30/19 171 lb 3.2 oz (77.7 kg)  Studies/Labs Reviewed:   EKG:  EKG is ordered today.  Personally reviewed, shows normal sinus rhythm and left axis deviation that does not quite meet criteria for left anterior fascicular block, otherwise normal, QTC 442 ms   Recent Labs: No results found for requested labs within last 8760 hours.  Lipid Panel  No results found for: CHOL, TRIG, HDL, CHOLHDL, VLDL, LDLCALC, LDLDIRECT, LABVLDL  08/12/2019 Cholesterol 163, HDL 45, LDL 77, triglycerides 202 Hemoglobin A1c 6% Hemoglobin 12.4, creatinine 0.8, potassium 4.5, normal liver function tests and TSH.  Additional studies/ records that were reviewed today include:   ASSESSMENT:    1. Nonrheumatic aortic valve stenosis   2. Chronic diastolic heart failure ([place])   3. Abnormal nuclear cardiac imaging test   4. Hyperlipidemia, mixed   5. Essential hypertension   6. Diabetes mellitus type 2 in nonobese (HCC)   7. Palpitation      PLAN:  In order of problems listed above:  CHF: This really only became evident as a problem when she was markedly anemic.  NYHA functional class I, appears euvolemic.  Her weight has gone up over the last year, but it may be true weight gain. AS: Has not been reevaluated for progression since 2015.  We will check an echocardiogram.  She does not  have exertional angina/dyspnea/syncope. Abn nuclear test: Asymptomatic.  On aspirin and statin. HLP: On statin.  LDL very close to target at 77. HTN: Very well controlled DM: Hemoglobin A1c 5.9% Palpitations: Recommend a 7-day event monitor.  Onset and termination suggest that this may be atrial arrhythmia   Medication Adjustments/Labs and Tests Ordered: Current medicines are reviewed at length with the patient today.  Concerns regarding medicines are outlined above.  Medication changes, Labs and Tests ordered today are listed in the Patient Instructions below. Patient Instructions  Medication Instructions:  No changes *If you need a refill on your cardiac medications before your next appointment, please call your pharmacy*   Lab Work: None ordered If you have labs (blood work) drawn today and your tests are completely normal, you will receive your results only by: [place] (if you have MyChart) OR A paper copy in the mail If you have any lab test that is abnormal or we need to change your treatment, we will call you to review the results.   Testing/Procedures: Your physician has requested that you have an echocardiogram. Echocardiography is a painless test that uses sound waves to create images of your heart. It provides your doctor with information about the size and shape of your heart and how well your heart's chambers and valves are working. You may receive an ultrasound enhancing agent through an IV if needed to better visualize your heart during the echo.This procedure takes approximately one hour. There are no restrictions for this procedure. This will take place at the [address].   ZIO XT- Long Term Monitor Instructions  Your physician has requested you wear a ZIO patch monitor for 7 days.  This is a single patch monitor. [employer] supplies one patch monitor per enrollment. Additional stickers are not available. Please do not apply patch if you will be  having a Nuclear Stress Test,  Echocardiogram, Cardiac CT, MRI, or Chest Xray during the period you would be wearing the  monitor. The patch cannot be worn during these tests. You cannot remove and re-apply the  ZIO XT patch monitor.  Your ZIO patch monitor will be mailed 3 day USPS to  your address on file. It may take 3-5 days  to receive your monitor after you have been enrolled.  Once you have received your monitor, please review the enclosed instructions. Your monitor  has already been registered assigning a specific monitor serial # to you.  Billing and Patient Assistance Program Information  We have supplied [employer] with any of your insurance information on file for billing purposes. [employer] offers a sliding scale Patient Assistance Program for patients that do not have  insurance, or whose insurance does not completely cover the cost of the ZIO monitor.  You must apply for the Patient Assistance Program to qualify for this discounted rate.  To apply, please call [employer] at [phone], select option 4, select option 2, ask to apply for  Patient Assistance Program. Theodore Demark will ask your household income, and how many people  are in your household. They will quote your out-of-pocket cost based on that information.  [employer] will also be able to set up a 80-month, interest-free payment plan if needed.  Applying the monitor   Shave hair from upper left chest.  Hold abrader disc by orange tab. Rub abrader in 40 strokes over the upper left chest as  indicated in your monitor instructions.  Clean area with 4 enclosed alcohol pads. Let dry.  Apply patch as indicated in monitor instructions. Patch will be placed under collarbone on left  side of chest with arrow pointing upward.  Rub patch adhesive wings for 2 minutes. Remove white label marked "1". Remove the white  label marked "2". Rub patch adhesive wings for 2 additional minutes.  While looking in a mirror, press and release button  in center of patch. A small green light will  flash 3-4 times. This will be your only indicator that the monitor has been turned on.  Do not shower for the first 24 hours. You may shower after the first 24 hours.  Press the button if you feel a symptom. You will hear a small click. Record Date, Time and  Symptom in the Patient Logbook.  When you are ready to remove the patch, follow instructions on the last 2 pages of Patient  Logbook. Stick patch monitor onto the last page of Patient Logbook.  Place Patient Logbook in the blue and white box. Use locking tab on box and tape box closed  securely. The blue and white box has prepaid postage on it. Please place it in the mailbox as  soon as possible. Your physician should have your test results approximately 7 days after the  monitor has been mailed back to [facility].  Call [place] at [phone] if you have questions regarding  your ZIO XT patch monitor. Call them immediately if you see an orange light blinking on your  monitor.  If your monitor falls off in less than 4 days, contact our Monitor department at [phone].  If your monitor becomes loose or falls off after 4 days call [employer] at [phone] for  suggestions on securing your monitor    Follow-Up: At [facility], you and your health needs are our priority.  As part of our continuing mission to provide you with exceptional heart care, we have created designated Provider Care Teams.  These Care Teams include your primary Cardiologist (physician) and Advanced Practice Providers (APPs -  Physician Assistants and Nurse Practitioners) who all work together to provide you with the care you need, when you need it.  We recommend signing up for the patient portal  called "MyChart".  Sign up information is provided on this After Visit Summary.  MyChart is used to connect with patients for Virtual Visits (Telemedicine).  Patients are able to view lab/test  results, encounter notes, upcoming appointments, etc.  Non-urgent messages can be sent to your provider as well.   To learn more about what you can do with MyChart, go to [email or web address].    Your next appointment:   12 month(s)  The format for your next appointment:   In Person  Provider:   You may see Sanda Klein, MD or one of the following Advanced Practice Providers on your designated Care Team:   Almyra Deforest, PA-C Fabian Sharp, [place] or  Roby Lofts, PA-C    Signed, Sanda Klein, MD  07/07/2021 3:00 PM    [address], [place], [place]  [address] Phone: [phone]; Fax: [phone]

## 2021-07-06 NOTE — Progress Notes (Unsigned)
Enrolled patient for a 7 day Zio XT monitor to be mailed to patients home.  

## 2021-07-06 NOTE — Patient Instructions (Signed)
Medication Instructions:  No changes *If you need a refill on your cardiac medications before your next appointment, please call your pharmacy*   Lab Work: None ordered If you have labs (blood work) drawn today and your tests are completely normal, you will receive your results only by: [place] (if you have MyChart) OR A paper copy in the mail If you have any lab test that is abnormal or we need to change your treatment, we will call you to review the results.   Testing/Procedures: Your physician has requested that you have an echocardiogram. Echocardiography is a painless test that uses sound waves to create images of your heart. It provides your doctor with information about the size and shape of your heart and how well your heart's chambers and valves are working. You may receive an ultrasound enhancing agent through an IV if needed to better visualize your heart during the echo.This procedure takes approximately one hour. There are no restrictions for this procedure. This will take place at the [address], [address].   ZIO XT- Long Term Monitor Instructions  Your physician has requested you wear a ZIO patch monitor for 7 days.  This is a single patch monitor. [employer] supplies one patch monitor per enrollment. Additional stickers are not available. Please do not apply patch if you will be having a Nuclear Stress Test,  Echocardiogram, Cardiac CT, MRI, or Chest Xray during the period you would be wearing the  monitor. The patch cannot be worn during these tests. You cannot remove and re-apply the  ZIO XT patch monitor.  Your ZIO patch monitor will be mailed 3 day USPS to your address on file. It may take 3-5 days  to receive your monitor after you have been enrolled.  Once you have received your monitor, please review the enclosed instructions. Your monitor  has already been registered assigning a specific monitor serial # to you.  Billing and Patient Assistance Program  Information  We have supplied [employer] with any of your insurance information on file for billing purposes. [employer] offers a sliding scale Patient Assistance Program for patients that do not have  insurance, or whose insurance does not completely cover the cost of the ZIO monitor.  You must apply for the Patient Assistance Program to qualify for this discounted rate.  To apply, please call [employer] at [phone], select option 4, select option 2, ask to apply for  Patient Assistance Program. Theodore Demark will ask your household income, and how many people  are in your household. They will quote your out-of-pocket cost based on that information.  [employer] will also be able to set up a 50-month, interest-free payment plan if needed.  Applying the monitor   Shave hair from upper left chest.  Hold abrader disc by orange tab. Rub abrader in 40 strokes over the upper left chest as  indicated in your monitor instructions.  Clean area with 4 enclosed alcohol pads. Let dry.  Apply patch as indicated in monitor instructions. Patch will be placed under collarbone on left  side of chest with arrow pointing upward.  Rub patch adhesive wings for 2 minutes. Remove white label marked "1". Remove the white  label marked "2". Rub patch adhesive wings for 2 additional minutes.  While looking in a mirror, press and release button in center of patch. A small green light will  flash 3-4 times. This will be your only indicator that the monitor has been turned on.  Do not shower  for the first 24 hours. You may shower after the first 24 hours.  Press the button if you feel a symptom. You will hear a small click. Record Date, Time and  Symptom in the Patient Logbook.  When you are ready to remove the patch, follow instructions on the last 2 pages of Patient  Logbook. Stick patch monitor onto the last page of Patient Logbook.  Place Patient Logbook in the blue and white box. Use locking tab on box and tape box closed   securely. The blue and white box has prepaid postage on it. Please place it in the mailbox as  soon as possible. Your physician should have your test results approximately 7 days after the  monitor has been mailed back to [facility].  Call [place] at [phone] if you have questions regarding  your ZIO XT patch monitor. Call them immediately if you see an orange light blinking on your  monitor.  If your monitor falls off in less than 4 days, contact our Monitor department at [phone].  If your monitor becomes loose or falls off after 4 days call [employer] at [phone] for  suggestions on securing your monitor    Follow-Up: At [facility], you and your health needs are our priority.  As part of our continuing mission to provide you with exceptional heart care, we have created designated Provider Care Teams.  These Care Teams include your primary Cardiologist (physician) and Advanced Practice Providers (APPs -  Physician Assistants and Nurse Practitioners) who all work together to provide you with the care you need, when you need it.  We recommend signing up for the patient portal called "MyChart".  Sign up information is provided on this After Visit Summary.  MyChart is used to connect with patients for Virtual Visits (Telemedicine).  Patients are able to view lab/test results, encounter notes, upcoming appointments, etc.  Non-urgent messages can be sent to your provider as well.   To learn more about what you can do with MyChart, go to [email or web address].    Your next appointment:   12 month(s)  The format for your next appointment:   In Person  Provider:   You may see Sanda Klein, MD or one of the following Advanced Practice Providers on your designated Care Team:   Almyra Deforest, PA-C Fabian Sharp, PA-C or  Roby Lofts, [place]

## 2021-07-07 ENCOUNTER — Encounter: Payer: Self-pay | Admitting: Cardiovascular Disease

## 2021-07-25 ENCOUNTER — Ambulatory Visit (HOSPITAL_COMMUNITY): Payer: Medicare Other

## 2021-08-17 ENCOUNTER — Other Ambulatory Visit: Payer: Self-pay

## 2021-08-17 ENCOUNTER — Ambulatory Visit (HOSPITAL_COMMUNITY): Payer: Medicare Other | Attending: Cardiovascular Disease

## 2021-08-17 DIAGNOSIS — I350 Nonrheumatic aortic (valve) stenosis: Secondary | ICD-10-CM | POA: Diagnosis not present

## 2021-08-17 LAB — ECHOCARDIOGRAM COMPLETE
AR max vel: 1.18 cm2
AV Area VTI: 1.02 cm2
AV Area mean vel: 1.04 cm2
AV Mean grad: 10.7 mmHg
AV Peak grad: 19.9 mmHg
Ao pk vel: 2.23 m/s
Area-P 1/2: 2.34 cm2
P 1/2 time: 724 msec
S' Lateral: 2.1 cm

## 2021-08-25 ENCOUNTER — Encounter: Payer: Self-pay | Admitting: Cardiovascular Disease

## 2021-08-25 ENCOUNTER — Other Ambulatory Visit: Payer: Self-pay

## 2021-08-25 ENCOUNTER — Ambulatory Visit: Payer: Medicare Other | Admitting: Cardiovascular Disease

## 2021-08-25 VITALS — BP 112/72 | HR 62 | Resp 20 | Ht 66.0 in | Wt 165.4 lb

## 2021-08-25 DIAGNOSIS — I350 Nonrheumatic aortic (valve) stenosis: Secondary | ICD-10-CM

## 2021-08-25 DIAGNOSIS — I5032 Chronic diastolic (congestive) heart failure: Secondary | ICD-10-CM | POA: Diagnosis not present

## 2021-08-25 DIAGNOSIS — E1169 Type 2 diabetes mellitus with other specified complication: Secondary | ICD-10-CM | POA: Diagnosis not present

## 2021-08-25 DIAGNOSIS — I10 Essential (primary) hypertension: Secondary | ICD-10-CM

## 2021-08-25 DIAGNOSIS — R931 Abnormal findings on diagnostic imaging of heart and coronary circulation: Secondary | ICD-10-CM | POA: Diagnosis not present

## 2021-08-25 DIAGNOSIS — E039 Hypothyroidism, unspecified: Secondary | ICD-10-CM | POA: Diagnosis not present

## 2021-08-25 DIAGNOSIS — E119 Type 2 diabetes mellitus without complications: Secondary | ICD-10-CM

## 2021-08-25 DIAGNOSIS — E785 Hyperlipidemia, unspecified: Secondary | ICD-10-CM | POA: Diagnosis not present

## 2021-08-25 DIAGNOSIS — R002 Palpitations: Secondary | ICD-10-CM

## 2021-08-25 DIAGNOSIS — E782 Mixed hyperlipidemia: Secondary | ICD-10-CM

## 2021-08-25 DIAGNOSIS — E538 Deficiency of other specified B group vitamins: Secondary | ICD-10-CM | POA: Diagnosis not present

## 2021-08-25 NOTE — Progress Notes (Signed)
Cardiology Office Note    Date:  08/25/2021   ID:  Meghan Santos, DOB 12-08-1934, MRN [id number]  PCP:  Crist Infante, MD  Cardiologist:   Sanda Klein, MD   No chief complaint on file.    History of Present Illness:  Meghan Santos is a 86 y.o. female with hypertension, aortic valve stenosis and well compensated diastolic heart failure as well as hyperlipidemia and chronic iron deficiency anemia.   She is accompanied today by her son.  They are here to discuss the results of her recent echocardiogram.  It does show that there has been progression of the severity of her aortic stenosis, although the parameters are little contradictory (mild aortic stenosis by gradients, moderate to severe aortic stenosis by dimensionless index and calculated valve area).  She is asymptomatic.  She denies any problems with exertional angina, exertional dyspnea or exertional near syncope/syncope.  She takes care of many of her own household chores, although she has somebody do the vacuuming for her.  She has been requiring less diuretic Dr. Joylene Draft recently reduce the dose of furosemide to 20 mg daily and she has not had problems with swelling she denies orthopnea or PND.  She was having palpitations and we had ordered a monitor, but after she changed her coffee drinking habits, the palpitations have not bothered her at all anymore.  She does not have  dizziness or focal neurological complaints.  She has adjusted better to living alone after her husband passed away couple of years ago after 31 years of marriage.    2015 echo shows borderline LVEF 19-50%, grade 2 diastolic dysfunction.  Follow-up echo in 2022 shows LVEF 60 to 65%, impaired relaxation pattern of the mitral inflow and worsening aortic stenosis. 2015 nuclear stress test has shown a very small region of apical ischemia. She has never undergone coronary angiography and she does not have angina pectoris. Note a remote history of chest  radiation therapy and chemotherapy with Adriamycin for breast cancer in 1998.  Past Medical History:  Diagnosis Date   Breast cancer ([place]) 1998   left breast with radiation and chemo   Diabetes ([place])    Essential hypertension 09/05/2014   Hypercholesteremia 09/05/2014   Hypercholesterolemia    Hypertension    Mild obesity 09/05/2014   Personal history of chemotherapy    Personal history of radiation therapy     Past Surgical History:  Procedure Laterality Date   BREAST LUMPECTOMY Left 11/1996   TONSILLECTOMY  1958   VAGINAL HYSTERECTOMY  1983    Current Medications: Outpatient Medications Prior to Visit  Medication Sig Dispense Refill   aspirin 81 MG tablet Take 81 mg by mouth.     benazepril-hydrochlorthiazide (LOTENSIN HCT) 10-12.5 MG per tablet Take 1 tablet by mouth daily.     Calcium Carbonate (CALCIUM 600 PO) Take 1 tablet by mouth daily.     Cholecalciferol (VITAMIN D) 2000 UNITS tablet Take 2,000 Units by mouth daily.     Coenzyme Q10 (CO Q-10) 100 MG CAPS Take 300 mg by mouth every morning.      Cyanocobalamin (VITAMIN B 12 PO) Take 1,000 mg by mouth every morning.     FLUoxetine (PROZAC) 40 MG capsule Take 40 mg by mouth daily.     furosemide (LASIX) 40 MG tablet TAKE 2 TABLETS(80 MG) BY MOUTH DAILY 180 tablet 3   levothyroxine (SYNTHROID, LEVOTHROID) 125 MCG tablet 125 mcg daily.      LORazepam (ATIVAN) 1 MG tablet  Take 1 mg by mouth at bedtime as needed. (Patient not taking: Reported on 07/06/2021)     metFORMIN (GLUCOPHAGE) 1000 MG tablet TK 1 T PO D AT SUPPER TIME  0   Multiple Vitamin (MULTI-VITAMIN DAILY PO) Take by mouth every morning.     mupirocin ointment (BACTROBAN) 2 % Apply topically 3 (three) times daily.     potassium chloride SA (KLOR-CON) 20 MEQ tablet TAKE 1 TABLET(20 MEQ) BY MOUTH DAILY 90 tablet 3   rosuvastatin (CRESTOR) 10 MG tablet 10 mg daily.      sitaGLIPtin (JANUVIA) 100 MG tablet Take 100 mg by mouth daily. Unknown dose      traZODone  (DESYREL) 50 MG tablet Take 1 tablet (50 mg total) by mouth at bedtime. (Patient not taking: Reported on 07/06/2021) 30 tablet 0   No facility-administered medications prior to visit.     Allergies:   Patient has no known allergies.   Social History   Socioeconomic History   Marital status: Married    Spouse name: Not on file   Number of children: Not on file   Years of education: Not on file   Highest education level: Not on file  Occupational History   Not on file  Tobacco Use   Smoking status: Never   Smokeless tobacco: Never   Tobacco comments:    never used tobacco  Vaping Use   Vaping Use: Never used  Substance and Sexual Activity   Alcohol use: Yes    Alcohol/week: 0.0 standard drinks    Comment: occas.   Drug use: No   Sexual activity: Not on file  Other Topics Concern   Not on file  Social History Narrative   Not on file   Social Determinants of Health   Financial Resource Strain: Not on file  Food Insecurity: Not on file  Transportation Needs: Not on file  Physical Activity: Not on file  Stress: Not on file  Social Connections: Not on file     Family History:  The patient's family history includes Alcoholism in her brother; Cancer in her sister; Dementia in her father; Diabetes in her brother; Heart attack in her mother.   ROS:   Please see the history of present illness.    ROS All other systems are reviewed and are negative.   PHYSICAL EXAM:   VS:  There were no vitals taken for this visit.     General: Alert, oriented x3, no distress, mildly overweight Head: no evidence of trauma, PERRL, EOMI, no exophtalmos or lid lag, no myxedema, no xanthelasma; normal ears, nose and oropharynx Neck: normal jugular venous pulsations and no hepatojugular reflux; brisk carotid pulses without delay and no carotid bruits Chest: clear to auscultation, no signs of consolidation by percussion or palpation, normal fremitus, symmetrical and full respiratory  excursions Cardiovascular: normal position and quality of the apical impulse, regular rhythm, normal first and distinct second heart sounds, 2/6 early peaking systolic ejection murmur heard best at the right upper sternal border, no diastolic murmurs, rubs or gallops Abdomen: no tenderness or distention, no masses by palpation, no abnormal pulsatility or arterial bruits, normal bowel sounds, no hepatosplenomegaly Extremities: no clubbing, cyanosis or edema; 2+ radial, ulnar and brachial pulses bilaterally; 2+ right femoral, posterior tibial and dorsalis pedis pulses; 2+ left femoral, posterior tibial and dorsalis pedis pulses; no subclavian or femoral bruits Neurological: grossly nonfocal Psych: Normal mood and affect     Wt Readings from Last 3 Encounters:  07/06/21 166 lb  9.6 oz (75.6 kg)  02/23/20 167 lb (75.8 kg)  10/30/19 171 lb 3.2 oz (77.7 kg)      Studies/Labs Reviewed:   ECHO 08/17/2021:   1. Left ventricular ejection fraction, by estimation, is 60 to 65%. The  left ventricle has normal function. The left ventricle has no regional  wall motion abnormalities. There is mild left ventricular hypertrophy.  Left ventricular diastolic parameters  are consistent with Grade I diastolic dysfunction (impaired relaxation).   2. Right ventricular systolic function is normal. The right ventricular  size is normal. There is normal pulmonary artery systolic pressure. The  estimated right ventricular systolic pressure is 62.9 mmHg.   3. The mitral valve is degenerative. Trivial mitral valve regurgitation.  No evidence of mitral stenosis. Moderate mitral annular calcification.   4. The inferior vena cava is normal in size with greater than 50%  respiratory variability, suggesting right atrial pressure of 3 mmHg.   5. The aortic valve is tricuspid. There is moderate calcification of the  aortic valve. Aortic valve regurgitation is mild. Moderate aortic valve  stenosis. Mild AS by gradients  (Vmax 2.5 m/s, MG 13 mmHg), but moderate to  severe by DI (0.28) and AVA  (0.9cm^2). Low SV index (27 cc/m^2), suspect paradoxical low flow low  gradient moderate AS   EKG:  EKG is not ordered today.  Personally reviewed last ECG which shows normal sinus rhythm and left axis deviation that does not quite meet criteria for left anterior fascicular block, otherwise normal, QTC 442 ms   Recent Labs: No results found for requested labs within last 8760 hours.  Lipid Panel  No results found for: CHOL, TRIG, HDL, CHOLHDL, VLDL, LDLCALC, LDLDIRECT, LABVLDL  08/12/2019 Cholesterol 163, HDL 45, LDL 77, triglycerides 202 Hemoglobin A1c 6% Hemoglobin 12.4, creatinine 0.8, potassium 4.5, normal liver function tests and TSH.  08/24/2020 Cholesterol 150, LDL 71, HDL 50, triglycerides 143  05/18/2021 Hemoglobin A1c 5.9%, glucose 102, TSH 0.95  Additional studies/ records that were reviewed today include:   ASSESSMENT:    No diagnosis found.    PLAN:  In order of problems listed above:  CHF: This really only became evident as a problem when she was markedly anemic.  NYHA functional class I, euvolemic on a very low-dose of diuretic. AS: Asymptomatic, but with evidence of progression by echo.  We will check her echo on a yearly basis.  She should call us if she develops exertional symptoms of angina/dyspnea/syncope.  Discussed SAVR versus TAVR, with the latter procedure being the most likely solution if her aortic stenosis progresses and becomes symptomatic. Abn nuclear test: Denies angina pectoris.  On aspirin and statin. HLP: On statin.  LDL 71. HTN: Well-controlled. DM: Excellent hemoglobin A1c of 5.9%. Palpitations: No longer bother her.  She never wore the monitor, we will cancel the procedure.   Medication Adjustments/Labs and Tests Ordered: Current medicines are reviewed at length with the patient today.  Concerns regarding medicines are outlined above.  Medication changes, Labs and  Tests ordered today are listed in the Patient Instructions below. There are no Patient Instructions on file for this visit.    Signed, Sanda Klein, MD  08/25/2021 8:53 AM    [facility] [address], [place], [place]  [address] Phone: [phone]; Fax: [phone]

## 2021-08-25 NOTE — Patient Instructions (Addendum)
Medication Instructions:  No changes *If you need a refill on your cardiac medications before your next appointment, please call your pharmacy*   Lab Work: None ordered If you have labs (blood work) drawn today and your tests are completely normal, you will receive your results only by: [place] (if you have MyChart) OR A paper copy in the mail If you have any lab test that is abnormal or we need to change your treatment, we will call you to review the results.   Testing/Procedures: Your physician has requested that you have an echocardiogram in one year    Follow-Up: At [facility], you and your health needs are our priority.  As part of our continuing mission to provide you with exceptional heart care, we have created designated Provider Care Teams.  These Care Teams include your primary Cardiologist (physician) and Advanced Practice Providers (APPs -  Physician Assistants and Nurse Practitioners) who all work together to provide you with the care you need, when you need it.  We recommend signing up for the patient portal called "MyChart".  Sign up information is provided on this After Visit Summary.  MyChart is used to connect with patients for Virtual Visits (Telemedicine).  Patients are able to view lab/test results, encounter notes, upcoming appointments, etc.  Non-urgent messages can be sent to your provider as well.   To learn more about what you can do with MyChart, go to [email or web address].    Your next appointment:   12 month(s)  The format for your next appointment:   In Person  Provider:   Sanda Klein, MD

## 2021-09-05 DIAGNOSIS — Z0000 Encounter for general adult medical examination without abnormal findings: Secondary | ICD-10-CM | POA: Diagnosis not present

## 2021-09-05 DIAGNOSIS — R82998 Other abnormal findings in urine: Secondary | ICD-10-CM | POA: Diagnosis not present

## 2021-09-05 DIAGNOSIS — E1169 Type 2 diabetes mellitus with other specified complication: Secondary | ICD-10-CM | POA: Diagnosis not present

## 2021-09-05 DIAGNOSIS — E039 Hypothyroidism, unspecified: Secondary | ICD-10-CM | POA: Diagnosis not present

## 2021-09-05 DIAGNOSIS — E785 Hyperlipidemia, unspecified: Secondary | ICD-10-CM | POA: Diagnosis not present

## 2021-09-07 DIAGNOSIS — C44729 Squamous cell carcinoma of skin of left lower limb, including hip: Secondary | ICD-10-CM | POA: Diagnosis not present

## 2021-09-07 DIAGNOSIS — Z85828 Personal history of other malignant neoplasm of skin: Secondary | ICD-10-CM | POA: Diagnosis not present

## 2021-09-07 DIAGNOSIS — D0462 Carcinoma in situ of skin of left upper limb, including shoulder: Secondary | ICD-10-CM | POA: Diagnosis not present

## 2021-09-07 DIAGNOSIS — D485 Neoplasm of uncertain behavior of skin: Secondary | ICD-10-CM | POA: Diagnosis not present

## 2021-10-10 DIAGNOSIS — C44319 Basal cell carcinoma of skin of other parts of face: Secondary | ICD-10-CM | POA: Diagnosis not present

## 2021-10-10 DIAGNOSIS — D0472 Carcinoma in situ of skin of left lower limb, including hip: Secondary | ICD-10-CM | POA: Diagnosis not present

## 2021-10-10 DIAGNOSIS — Z85828 Personal history of other malignant neoplasm of skin: Secondary | ICD-10-CM | POA: Diagnosis not present

## 2021-10-10 DIAGNOSIS — L820 Inflamed seborrheic keratosis: Secondary | ICD-10-CM | POA: Diagnosis not present

## 2021-11-10 DIAGNOSIS — Z85828 Personal history of other malignant neoplasm of skin: Secondary | ICD-10-CM | POA: Diagnosis not present

## 2021-11-10 DIAGNOSIS — C44729 Squamous cell carcinoma of skin of left lower limb, including hip: Secondary | ICD-10-CM | POA: Diagnosis not present

## 2021-12-13 DIAGNOSIS — L570 Actinic keratosis: Secondary | ICD-10-CM | POA: Diagnosis not present

## 2021-12-13 DIAGNOSIS — D485 Neoplasm of uncertain behavior of skin: Secondary | ICD-10-CM | POA: Diagnosis not present

## 2021-12-13 DIAGNOSIS — Z85828 Personal history of other malignant neoplasm of skin: Secondary | ICD-10-CM | POA: Diagnosis not present

## 2021-12-27 DIAGNOSIS — I10 Essential (primary) hypertension: Secondary | ICD-10-CM | POA: Diagnosis not present

## 2022-03-14 DIAGNOSIS — D485 Neoplasm of uncertain behavior of skin: Secondary | ICD-10-CM | POA: Diagnosis not present

## 2022-03-14 DIAGNOSIS — D0439 Carcinoma in situ of skin of other parts of face: Secondary | ICD-10-CM | POA: Diagnosis not present

## 2022-03-14 DIAGNOSIS — Z85828 Personal history of other malignant neoplasm of skin: Secondary | ICD-10-CM | POA: Diagnosis not present

## 2022-03-14 DIAGNOSIS — L821 Other seborrheic keratosis: Secondary | ICD-10-CM | POA: Diagnosis not present

## 2022-03-28 DIAGNOSIS — E785 Hyperlipidemia, unspecified: Secondary | ICD-10-CM | POA: Diagnosis not present

## 2022-03-28 DIAGNOSIS — E039 Hypothyroidism, unspecified: Secondary | ICD-10-CM | POA: Diagnosis not present

## 2022-03-28 DIAGNOSIS — D649 Anemia, unspecified: Secondary | ICD-10-CM | POA: Diagnosis not present

## 2022-03-28 DIAGNOSIS — E1169 Type 2 diabetes mellitus with other specified complication: Secondary | ICD-10-CM | POA: Diagnosis not present

## 2022-04-24 ENCOUNTER — Emergency Department (HOSPITAL_BASED_OUTPATIENT_CLINIC_OR_DEPARTMENT_OTHER): Payer: Medicare Other

## 2022-04-24 ENCOUNTER — Ambulatory Visit
Admission: EM | Admit: 2022-04-24 | Discharge: 2022-04-24 | Disposition: A | Discharge status: Home / Self Care | Payer: Medicare Other

## 2022-04-24 ENCOUNTER — Encounter: Payer: Self-pay | Admitting: *Deleted

## 2022-04-24 ENCOUNTER — Emergency Department (HOSPITAL_BASED_OUTPATIENT_CLINIC_OR_DEPARTMENT_OTHER): Payer: Medicare Other | Admitting: Radiology

## 2022-04-24 ENCOUNTER — Encounter (HOSPITAL_BASED_OUTPATIENT_CLINIC_OR_DEPARTMENT_OTHER): Payer: Self-pay

## 2022-04-24 ENCOUNTER — Emergency Department (HOSPITAL_BASED_OUTPATIENT_CLINIC_OR_DEPARTMENT_OTHER)
Admission: EM | Admit: 2022-04-24 | Discharge: 2022-04-24 | Disposition: A | Discharge status: Home / Self Care | Payer: Medicare Other | Attending: Emergency Medicine | Admitting: Emergency Medicine

## 2022-04-24 DIAGNOSIS — S0990XA Unspecified injury of head, initial encounter: Secondary | ICD-10-CM | POA: Diagnosis not present

## 2022-04-24 DIAGNOSIS — S60511A Abrasion of right hand, initial encounter: Secondary | ICD-10-CM | POA: Diagnosis not present

## 2022-04-24 DIAGNOSIS — M1711 Unilateral primary osteoarthritis, right knee: Secondary | ICD-10-CM | POA: Diagnosis not present

## 2022-04-24 DIAGNOSIS — S0011XA Contusion of right eyelid and periocular area, initial encounter: Secondary | ICD-10-CM | POA: Insufficient documentation

## 2022-04-24 DIAGNOSIS — S8002XA Contusion of left knee, initial encounter: Secondary | ICD-10-CM | POA: Diagnosis not present

## 2022-04-24 DIAGNOSIS — S80211A Abrasion, right knee, initial encounter: Secondary | ICD-10-CM | POA: Diagnosis not present

## 2022-04-24 DIAGNOSIS — M1712 Unilateral primary osteoarthritis, left knee: Secondary | ICD-10-CM | POA: Diagnosis not present

## 2022-04-24 DIAGNOSIS — Z7984 Long term (current) use of oral hypoglycemic drugs: Secondary | ICD-10-CM | POA: Diagnosis not present

## 2022-04-24 DIAGNOSIS — Z79899 Other long term (current) drug therapy: Secondary | ICD-10-CM | POA: Insufficient documentation

## 2022-04-24 DIAGNOSIS — T07XXXA Unspecified multiple injuries, initial encounter: Secondary | ICD-10-CM

## 2022-04-24 DIAGNOSIS — Z7982 Long term (current) use of aspirin: Secondary | ICD-10-CM | POA: Diagnosis not present

## 2022-04-24 DIAGNOSIS — S80212A Abrasion, left knee, initial encounter: Secondary | ICD-10-CM | POA: Diagnosis not present

## 2022-04-24 DIAGNOSIS — S50311A Abrasion of right elbow, initial encounter: Secondary | ICD-10-CM | POA: Diagnosis not present

## 2022-04-24 DIAGNOSIS — S8001XA Contusion of right knee, initial encounter: Secondary | ICD-10-CM | POA: Diagnosis not present

## 2022-04-24 DIAGNOSIS — R220 Localized swelling, mass and lump, head: Secondary | ICD-10-CM | POA: Diagnosis not present

## 2022-04-24 DIAGNOSIS — M19031 Primary osteoarthritis, right wrist: Secondary | ICD-10-CM | POA: Diagnosis not present

## 2022-04-24 DIAGNOSIS — W19XXXA Unspecified fall, initial encounter: Secondary | ICD-10-CM

## 2022-04-24 DIAGNOSIS — S0083XA Contusion of other part of head, initial encounter: Secondary | ICD-10-CM | POA: Diagnosis not present

## 2022-04-24 DIAGNOSIS — S0993XA Unspecified injury of face, initial encounter: Secondary | ICD-10-CM | POA: Diagnosis not present

## 2022-04-24 DIAGNOSIS — Z043 Encounter for examination and observation following other accident: Secondary | ICD-10-CM | POA: Diagnosis not present

## 2022-04-24 NOTE — Discharge Instructions (Signed)
Your CT scans and x-rays are negative for serious traumatic injury.  Use ice, Tylenol or Motrin as needed for aches and swelling.  Follow-up with your doctor this week for recheck.  Return to the ED with worsening pain, vomiting, behavior change, not eating or drinking, any other concerns

## 2022-04-24 NOTE — Discharge Instructions (Signed)
Go to the emergency department as soon as you leave urgent care for further evaluation and management. 

## 2022-04-24 NOTE — ED Triage Notes (Signed)
Reports falling while pushing a cart in the parking lot of the grocery store. States she did not experience any dizziness or lightheadedness prior to fall. Ecchymosis and swelling noted to right periorbital region. Multiple abrasions, small areas of ecchymosis to right elbow, right posterior hand, bilateral knees. Denies any dizziness, lightheadedness at present. Denies n/v. Denies vision changes. Has been applying ice to right periorbital region.

## 2022-04-24 NOTE — ED Triage Notes (Signed)
Pt fell this afternoon in parking lot on face.  Denies LOC Rt. Orbital swelling and bruising.   Denies dizziness or HA.  A & O x4 NAD Denies pain

## 2022-04-24 NOTE — ED Provider Notes (Signed)
[facility] EMERGENCY DEPT Provider Note   CSN: [id number] Arrival date & time: 04/24/22  1907     History  Chief Complaint  Patient presents with   Meghan Santos is a 87 y.o. female.  Patient with fall and facial injury.  States she was pushing her grocery cart away when she turned and believes that she stumbled falling forward striking her face on the ground.  Did not lose consciousness.  Sustained abrasion and hematoma to right forehead and eye area.  Abrasions to right elbow, bilateral knees, right dorsal hand.  EMS arrived but did not transport her to the hospital because she wanted to take her groceries home.  She denies any blood thinner use.  Denies any visual changes.  Denies any neck or back pain.  No chest or abdominal pain.  No difficulty breathing.  She is not certain what made her fall but did not have any dizzy spell prior falling. Denies any pain.  Denies any visual changes.  Has multiple abrasions to right elbow and bilateral knees.  Able to ambulate  The history is provided by the patient.  Fall       Home Medications Prior to Admission medications   Medication Sig Start Date End Date Taking? Authorizing Provider  aspirin 81 MG tablet Take 81 mg by mouth.    [provider]  benazepril-hydrochlorthiazide (LOTENSIN HCT) 10-12.5 MG per tablet Take 1 tablet by mouth daily. 04/13/12   [provider]  Calcium Carbonate (CALCIUM 600 PO) Take 1 tablet by mouth daily.    [provider]  Cholecalciferol (VITAMIN D) 2000 UNITS tablet Take 2,000 Units by mouth daily.    [provider]  Coenzyme Q10 (CO Q-10) 100 MG CAPS Take 300 mg by mouth every morning.     [provider]  Cyanocobalamin (VITAMIN B 12 PO) Take 1,000 mg by mouth every morning.    [provider]  FLUoxetine (PROZAC) 40 MG capsule Take 40 mg by mouth daily. 04/13/21   [provider]  furosemide (LASIX) 40 MG tablet  TAKE 2 TABLETS(80 MG) BY MOUTH DAILY Patient taking differently: 10 mg. 11/27/19   Almyra Deforest, PA  levothyroxine (SYNTHROID, LEVOTHROID) 125 MCG tablet 125 mcg daily. Specialized dosing 04/12/12   [provider]  LORazepam (ATIVAN) 1 MG tablet Take 1 mg by mouth at bedtime as needed. 12/08/19   [provider]  metFORMIN (GLUCOPHAGE) 1000 MG tablet TK 1 T PO D AT SUPPER TIME 06/06/17   [provider]  Multiple Vitamin (MULTI-VITAMIN DAILY PO) Take by mouth every morning.    [provider]  mupirocin ointment (BACTROBAN) 2 % Apply topically 3 (three) times daily. 05/18/21   [provider]  potassium chloride SA (KLOR-CON) 20 MEQ tablet TAKE 1 TABLET(20 MEQ) BY MOUTH DAILY 12/23/19   Croitoru, Mihai, MD  rosuvastatin (CRESTOR) 10 MG tablet 10 mg daily.  02/19/12   [provider]  sitaGLIPtin (JANUVIA) 100 MG tablet Take 100 mg by mouth daily. Unknown dose     [provider]      Allergies    Patient has no known allergies.    Review of Systems   Review of Systems  Physical Exam Updated Vital Signs BP (!) 150/101 (BP Location: Right Arm)   Pulse 62   Temp 97.8 F (36.6 C)   Resp 15   Ht '5\' 6"'$  (1.676 m)   Wt 72.6 kg   SpO2 98%  BMI 25.82 kg/m  Physical Exam Vitals and nursing note reviewed.  Constitutional:      General: She is not in acute distress.    Appearance: She is well-developed.  HENT:     Head: Normocephalic.     Comments: Right periorbital hematoma and ecchymosis.  Extraocular movements are intact.    Mouth/Throat:     Pharynx: No oropharyngeal exudate.  Eyes:     Conjunctiva/sclera: Conjunctivae normal.     Pupils: Pupils are equal, round, and reactive to light.  Neck:     Comments: No midline C-spine tender Cardiovascular:     Rate and Rhythm: Normal rate and regular rhythm.     Heart sounds: Normal heart sounds. No murmur heard. Pulmonary:     Effort: Pulmonary effort is normal. No respiratory  distress.     Breath sounds: Normal breath sounds.  Chest:     Chest wall: No tenderness.  Abdominal:     Palpations: Abdomen is soft.     Tenderness: There is no abdominal tenderness. There is no guarding or rebound.  Musculoskeletal:        General: Tenderness present. Normal range of motion.     Cervical back: Normal range of motion and neck supple.     Comments: Abrasion right lateral elbow, right dorsal hand, bilateral knees without bony tenderness.  Range of motion intact of bilateral hips and knees.  Skin:    General: Skin is warm.  Neurological:     Mental Status: She is alert and oriented to person, place, and time.     Cranial Nerves: No cranial nerve deficit.     Motor: No abnormal muscle tone.     Coordination: Coordination normal.     Comments:  5/5 strength throughout. CN 2-12 intact.Equal grip strength.   Psychiatric:        Behavior: Behavior normal.     ED Results / Procedures / Treatments   Labs (all labs ordered are listed, but only abnormal results are displayed) Labs Reviewed - No data to display  EKG None  Radiology CT Head Wo Contrast  Result Date: 04/24/2022 CLINICAL DATA:  Fall EXAM: CT HEAD WITHOUT CONTRAST CT CERVICAL SPINE WITHOUT CONTRAST TECHNIQUE: Multidetector CT imaging of the head and cervical spine was performed following the standard protocol without intravenous contrast. Multiplanar CT image reconstructions of the cervical spine were also generated. RADIATION DOSE REDUCTION: This exam was performed according to the departmental dose-optimization program which includes automated exposure control, adjustment of the mA and/or kV according to patient size and/or use of iterative reconstruction technique. COMPARISON:  None Available. FINDINGS: CT HEAD FINDINGS Brain: There is no mass, hemorrhage or extra-axial collection. There is generalized atrophy without lobar predilection. There is hypoattenuation of the periventricular white matter, most  commonly indicating chronic ischemic microangiopathy. Vascular: No abnormal hyperdensity of the major intracranial arteries or dural venous sinuses. No intracranial atherosclerosis. Skull: The visualized skull base, calvarium and extracranial soft tissues are normal. Sinuses/Orbits: No fluid levels or advanced mucosal thickening of the visualized paranasal sinuses. No mastoid or middle ear effusion. The orbits are normal. CT CERVICAL SPINE FINDINGS Alignment: No static subluxation. Facets are aligned. Occipital condyles are normally positioned. Skull base and vertebrae: No acute fracture. Soft tissues and spinal canal: No prevertebral fluid or swelling. No visible canal hematoma. Disc levels: No advanced spinal canal or neural foraminal stenosis. Upper chest: No pneumothorax, pulmonary nodule or pleural effusion. Other: Normal visualized paraspinal cervical soft tissues. IMPRESSION: 1. No acute intracranial  abnormality. 2. No acute fracture or static subluxation of the cervical spine. Electronically Signed   By: Ulyses Jarred M.D.   On: 04/24/2022 22:22   CT Cervical Spine Wo Contrast  Result Date: 04/24/2022 CLINICAL DATA:  Fall EXAM: CT HEAD WITHOUT CONTRAST CT CERVICAL SPINE WITHOUT CONTRAST TECHNIQUE: Multidetector CT imaging of the head and cervical spine was performed following the standard protocol without intravenous contrast. Multiplanar CT image reconstructions of the cervical spine were also generated. RADIATION DOSE REDUCTION: This exam was performed according to the departmental dose-optimization program which includes automated exposure control, adjustment of the mA and/or kV according to patient size and/or use of iterative reconstruction technique. COMPARISON:  None Available. FINDINGS: CT HEAD FINDINGS Brain: There is no mass, hemorrhage or extra-axial collection. There is generalized atrophy without lobar predilection. There is hypoattenuation of the periventricular white matter, most commonly  indicating chronic ischemic microangiopathy. Vascular: No abnormal hyperdensity of the major intracranial arteries or dural venous sinuses. No intracranial atherosclerosis. Skull: The visualized skull base, calvarium and extracranial soft tissues are normal. Sinuses/Orbits: No fluid levels or advanced mucosal thickening of the visualized paranasal sinuses. No mastoid or middle ear effusion. The orbits are normal. CT CERVICAL SPINE FINDINGS Alignment: No static subluxation. Facets are aligned. Occipital condyles are normally positioned. Skull base and vertebrae: No acute fracture. Soft tissues and spinal canal: No prevertebral fluid or swelling. No visible canal hematoma. Disc levels: No advanced spinal canal or neural foraminal stenosis. Upper chest: No pneumothorax, pulmonary nodule or pleural effusion. Other: Normal visualized paraspinal cervical soft tissues. IMPRESSION: 1. No acute intracranial abnormality. 2. No acute fracture or static subluxation of the cervical spine. Electronically Signed   By: Ulyses Jarred M.D.   On: 04/24/2022 22:22   CT Maxillofacial Wo Contrast  Result Date: 04/24/2022 CLINICAL DATA:  [place] today with trauma to the face. EXAM: CT MAXILLOFACIAL WITHOUT CONTRAST TECHNIQUE: Multidetector CT imaging of the maxillofacial structures was performed. Multiplanar CT image reconstructions were also generated. RADIATION DOSE REDUCTION: This exam was performed according to the departmental dose-optimization program which includes automated exposure control, adjustment of the mA and/or kV according to patient size and/or use of iterative reconstruction technique. COMPARISON:  None FINDINGS: Osseous: No facial fracture. Orbits: Periorbital soft tissue swelling on the right. No postseptal orbital injury. Sinuses: Clear Soft tissues: Soft tissue swelling of the right cheek with focal hematoma. Limited intracranial: No acute intracranial finding. IMPRESSION: Soft tissue swelling of the right cheek  with a focal hematoma measuring up to 2 cm. Right preseptal periorbital soft tissue swelling. No evidence of intraorbital injury. No facial fracture. Electronically Signed   By: Nelson Chimes M.D.   On: 04/24/2022 20:22    Procedures Procedures    Medications Ordered in ED Medications - No data to display  ED Course/ Medical Decision Making/ A&P                           Medical Decision Making Amount and/or Complexity of Data Reviewed Labs: ordered. Decision-making details documented in ED Course. Radiology: ordered and independent interpretation performed. Decision-making details documented in ED Course. ECG/medicine tests: ordered and independent interpretation performed. Decision-making details documented in ED Course.  Fall with head injury and face injury.  No loss of consciousness.  No blood thinner use.  Neurovascularly intact.  CT scan in triage shows no intracranial hemorrhage or facial fracture.  Results reviewed and interpreted by me No facial fracture, no C-spine injury,  no intracranial hemorrhage.  Patient tolerating p.o. and ambulatory.  CTs and x-rays reviewed with patient and son at bedside.  Discussed supportive care with anti-inflammatories, ice, elevation and PCP follow-up.  Return precautions discussed        Final Clinical Impression(s) / ED Diagnoses Final diagnoses:  Fall, initial encounter  Multiple contusions    Rx / DC Orders ED Discharge Orders     None         Noah Pelaez, Annie Main, MD 04/24/22 2311

## 2022-04-24 NOTE — ED Provider Notes (Signed)
[name]    CSN: [id number] Arrival date & time: 04/24/22  1749      History   Chief Complaint Chief Complaint  Patient presents with   Fall    HPI Meghan Santos is a 87 y.o. female.   Patient presents for further evaluation after a fall that occurred about 3 PM today.  Patient reports that she was in the parking lot at the grocery store when it occurred.  She was pushing a grocery buggy back and started walking back to her car when she tripped and fell.  She states that she landed directly on her face and also scraped her elbow, hand, right knee.  Denies any dizziness or lightheadedness prior to fall.  She was evaluated by EMS on scene who suggested hospital transport but she declined stating that she wanted to get her groceries back home.  Denies losing consciousness.  Patient reports that she takes aspirin but no other blood thinning medications.  Denies any stated headache, dizziness, nausea, vomiting, blurred vision.  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      Home Medications    Prior to Admission medications   Medication Sig Start Date End Date Taking? Authorizing Provider  aspirin 81 MG tablet Take 81 mg by mouth.   Yes [provider]  benazepril-hydrochlorthiazide (LOTENSIN HCT) 10-12.5 MG per tablet Take 1 tablet by mouth daily. 04/13/12  Yes [provider]  Calcium Carbonate (CALCIUM 600 PO) Take 1 tablet by mouth daily.   Yes [provider]  Coenzyme Q10 (CO Q-10) 100 MG CAPS Take 300 mg by mouth every morning.    Yes [provider]  Cyanocobalamin (VITAMIN B 12 PO) Take 1,000 mg by mouth every morning.   Yes [provider]  FLUoxetine (PROZAC) 40 MG capsule Take 40 mg by mouth daily. 04/13/21  Yes [provider]  furosemide (LASIX) 40 MG tablet TAKE 2 TABLETS(80 MG) BY MOUTH DAILY Patient taking differently: 10 mg. 11/27/19  Yes Almyra Deforest, PA  levothyroxine (SYNTHROID, LEVOTHROID) 125 MCG tablet 125 mcg daily. Specialized dosing 04/12/12  Yes [provider]  metFORMIN (GLUCOPHAGE) 1000 MG tablet TK 1 T PO D AT SUPPER TIME 06/06/17  Yes [provider]  potassium chloride SA (KLOR-CON) 20 MEQ tablet TAKE 1 TABLET(20 MEQ) BY MOUTH DAILY 12/23/19  Yes Croitoru, Mihai, MD  rosuvastatin (CRESTOR) 10 MG tablet 10 mg daily.  02/19/12  Yes [provider]  Cholecalciferol (VITAMIN D) 2000 UNITS tablet Take 2,000 Units by mouth daily.    [provider]  LORazepam (ATIVAN) 1 MG tablet Take 1 mg by mouth at bedtime as needed. 12/08/19   [provider]  Multiple Vitamin (MULTI-VITAMIN DAILY PO) Take by mouth every morning.    [provider]  mupirocin ointment (BACTROBAN) 2 % Apply topically 3 (three) times daily. 05/18/21   [provider]  sitaGLIPtin (JANUVIA) 100 MG tablet Take 100 mg by mouth daily. Unknown dose     [provider]    Family  History Family History  Problem Relation Age of Onset   Heart attack Mother    Dementia Father    Diabetes Brother    Cancer Sister        colon   Alcoholism Brother     Social History Social History   Tobacco Use   Smoking status: Never   Smokeless tobacco: Never   Tobacco comments:    never used tobacco  Vaping Use   Vaping Use: Never used  Substance Use Topics   Alcohol use: Yes    Comment: occasional wine   Drug use: No     Allergies   Patient has no known allergies.   Review of Systems Review of Systems Per HPI  Physical Exam Triage Vital Signs ED Triage Vitals  Enc Vitals Group     BP 04/24/22 1820 (!) 148/62     Pulse Rate 04/24/22 1820 64     Resp 04/24/22 1820 18     Temp 04/24/22 1820 97.9 F (36.6 C)     Temp Source 04/24/22 1820 Oral     SpO2 04/24/22 1820 93 %     Weight --      Height --      Head Circumference --      Peak Flow --      Pain Score 04/24/22 1822 0     Pain Loc --      Pain Edu? --      Excl. in [place]? --    No data found.  Updated Vital Signs BP (!) 148/62   Pulse 64   Temp 97.9 F (36.6 C) (Oral)   Resp 18   SpO2 93%   Visual Acuity Right Eye Distance:   Left Eye Distance:   Bilateral Distance:    Right Eye Near:   Left Eye Near:    Bilateral Near:     Physical Exam Constitutional:      General: She is not in acute distress.    Appearance: Normal appearance. She is not toxic-appearing or diaphoretic.  HENT:     Head: Normocephalic and atraumatic.     Comments: Significant bruising and swelling below right eye into upper cheek.  Also has some swelling to right upper eyelid to lateral portion.  No obvious discoloration, redness, swelling to globe of eye.  No drainage noted. Eyes:     Extraocular Movements: Extraocular movements intact.     Conjunctiva/sclera: Conjunctivae normal.     Pupils: Pupils are equal, round, and reactive to light.  Pulmonary:     Effort: Pulmonary effort is normal.  Skin:     Comments: Scattered abrasions present throughout right forearm, right elbow, right hand, right anterior knee.  Bleeding controlled.  Neurological:     General: No focal deficit present.     Mental Status: She is alert and oriented to person, place, and time. Mental status is at baseline.  Cranial Nerves: Cranial nerves 2-12 are intact.     Sensory: Sensation is intact.     Motor: Motor function is intact.     Coordination: Coordination is intact.     Gait: Gait is intact.  Psychiatric:        Mood and Affect: Mood normal.        Behavior: Behavior normal.        Thought Content: Thought content normal.        Judgment: Judgment normal.      UC Treatments / Results  Labs (all labs ordered are listed, but only abnormal results are displayed) Labs Reviewed - No data to display  EKG   Radiology No results found.  Procedures Procedures (including critical care time)  Medications Ordered in UC Medications - No data to display  Initial Impression / Assessment and Plan / UC Course  I have reviewed the triage vital signs and the nursing notes.  Pertinent labs & imaging results that were available during my care of the patient were reviewed by me and considered in my medical decision making (see chart for details).     Given fall with associated right eye swelling/injury with head injury, I do think this warrants further evaluation and more advanced imaging at the emergency department given limited resources here in urgent care.  Patient was advised to go to the ER for further evaluation and management.  Patient was agreeable with plan.  Vital signs stable at discharge.  Agree with patient's son transporting her to the hospital. Final Clinical Impressions(s) / UC Diagnoses   Final diagnoses:  Fall, initial encounter  Injury of head, initial encounter     Discharge Instructions      Go to the emergency department as soon as you leave urgent care for further evaluation  and management.    ED Prescriptions   None    PDMP not reviewed this encounter.   Teodora Medici, [place] 04/24/22 [phone]

## 2022-04-28 DIAGNOSIS — W19XXXA Unspecified fall, initial encounter: Secondary | ICD-10-CM | POA: Diagnosis not present

## 2022-04-28 DIAGNOSIS — T07XXXA Unspecified multiple injuries, initial encounter: Secondary | ICD-10-CM | POA: Diagnosis not present

## 2022-04-28 DIAGNOSIS — S0993XA Unspecified injury of face, initial encounter: Secondary | ICD-10-CM | POA: Diagnosis not present

## 2022-04-28 DIAGNOSIS — R2689 Other abnormalities of gait and mobility: Secondary | ICD-10-CM | POA: Diagnosis not present

## 2022-05-24 ENCOUNTER — Emergency Department (HOSPITAL_BASED_OUTPATIENT_CLINIC_OR_DEPARTMENT_OTHER): Payer: Medicare Other

## 2022-05-24 ENCOUNTER — Other Ambulatory Visit: Payer: Self-pay

## 2022-05-24 ENCOUNTER — Emergency Department (HOSPITAL_BASED_OUTPATIENT_CLINIC_OR_DEPARTMENT_OTHER)
Admission: EM | Admit: 2022-05-24 | Discharge: 2022-05-24 | Disposition: A | Discharge status: Home / Self Care | Payer: Medicare Other | Attending: Emergency Medicine | Admitting: Emergency Medicine

## 2022-05-24 ENCOUNTER — Encounter (HOSPITAL_BASED_OUTPATIENT_CLINIC_OR_DEPARTMENT_OTHER): Payer: Self-pay

## 2022-05-24 DIAGNOSIS — Z79899 Other long term (current) drug therapy: Secondary | ICD-10-CM | POA: Insufficient documentation

## 2022-05-24 DIAGNOSIS — Z043 Encounter for examination and observation following other accident: Secondary | ICD-10-CM | POA: Diagnosis not present

## 2022-05-24 DIAGNOSIS — W19XXXA Unspecified fall, initial encounter: Secondary | ICD-10-CM | POA: Diagnosis not present

## 2022-05-24 DIAGNOSIS — Z7982 Long term (current) use of aspirin: Secondary | ICD-10-CM | POA: Diagnosis not present

## 2022-05-24 DIAGNOSIS — S0101XA Laceration without foreign body of scalp, initial encounter: Secondary | ICD-10-CM | POA: Diagnosis not present

## 2022-05-24 DIAGNOSIS — S0990XA Unspecified injury of head, initial encounter: Secondary | ICD-10-CM | POA: Diagnosis not present

## 2022-05-24 MED ORDER — LIDOCAINE-EPINEPHRINE-TETRACAINE (LET) TOPICAL GEL
3.0000 mL | Freq: Once | TOPICAL | Status: AC
  Administered 2022-05-24: 3 mL via TOPICAL
  Filled 2022-05-24: qty 3

## 2022-05-24 MED ORDER — TETANUS-DIPHTH-ACELL PERTUSSIS 5-2.5-18.5 LF-MCG/0.5 IM SUSY
0.5000 mL | PREFILLED_SYRINGE | Freq: Once | INTRAMUSCULAR | Status: DC
  Filled 2022-05-24: qty 0.5

## 2022-05-24 NOTE — ED Triage Notes (Addendum)
Patient here POV from Home.  Endorses Standing This AM at 0300 when she Lost her Balance and fell onto a Dresser. Laceration, 3 cm, to posterior Head with Bleeding Controlled.  No LOC. No Dizziness. No Pain. No Vision Changes. No Anticoagulants. Patient called PCP for Laceration Repair but was sent for Imaging as well.  NAD Noted during Triage. A&Ox4. GCS 15. Ambulatory.

## 2022-05-24 NOTE — Discharge Instructions (Addendum)
Watch for signs of developing infection which would include worsening pain, redness, purulent discharge, fever or chills.  Clean the wound with warm soapy water daily as tolerated.  Tylenol for pain control.

## 2022-05-24 NOTE — ED Provider Notes (Signed)
[facility] Provider Note   CSN: [id number] Arrival date & time: 05/24/22  1356     History  Chief Complaint  Patient presents with   Head Injury    [place] is a 87 y.o. female.   Head Injury    87 year old female presenting to the emergency department after a fall at home.  The patient states that she was standing this morning when she briefly lost her balance and fell onto a dresser.  She landed on the back of her head sustaining a 3 cm laceration to the posterior head.  Bleeding is controlled.  She denies any loss of consciousness.  She is not on anticoagulation.  She denies any pain or lightheadedness.  She denies any vision changes or other neurologic deficits.  She denies any other injuries or complaints at this time.  She arrived to the emergency department GCS 15, ABC intact.  She is unclear of her last tetanus status but declines tetanus today.  Home Medications Prior to Admission medications   Medication Sig Start Date End Date Taking? Authorizing Provider  aspirin 81 MG tablet Take 81 mg by mouth.    [provider]  benazepril-hydrochlorthiazide (LOTENSIN HCT) 10-12.5 MG per tablet Take 1 tablet by mouth daily. 04/13/12   [provider]  Calcium Carbonate (CALCIUM 600 PO) Take 1 tablet by mouth daily.    [provider]  Cholecalciferol (VITAMIN D) 2000 UNITS tablet Take 2,000 Units by mouth daily.    [provider]  Coenzyme Q10 (CO Q-10) 100 MG CAPS Take 300 mg by mouth every morning.     [provider]  Cyanocobalamin (VITAMIN B 12 PO) Take 1,000 mg by mouth every morning.    [provider]  FLUoxetine (PROZAC) 40 MG capsule Take 40 mg by mouth daily. 04/13/21   [provider]  furosemide (LASIX) 40 MG tablet TAKE 2 TABLETS(80 MG) BY MOUTH DAILY Patient taking differently: 10 mg. 11/27/19   Almyra Deforest, PA  levothyroxine (SYNTHROID, LEVOTHROID) 125 MCG tablet 125  mcg daily. Specialized dosing 04/12/12   [provider]  LORazepam (ATIVAN) 1 MG tablet Take 1 mg by mouth at bedtime as needed. 12/08/19   [provider]  metFORMIN (GLUCOPHAGE) 1000 MG tablet TK 1 T PO D AT SUPPER TIME 06/06/17   [provider]  Multiple Vitamin (MULTI-VITAMIN DAILY PO) Take by mouth every morning.    [provider]  mupirocin ointment (BACTROBAN) 2 % Apply topically 3 (three) times daily. 05/18/21   [provider]  potassium chloride SA (KLOR-CON) 20 MEQ tablet TAKE 1 TABLET(20 MEQ) BY MOUTH DAILY 12/23/19   Croitoru, Mihai, MD  rosuvastatin (CRESTOR) 10 MG tablet 10 mg daily.  02/19/12   [provider]  sitaGLIPtin (JANUVIA) 100 MG tablet Take 100 mg by mouth daily. Unknown dose     [provider]      Allergies    Patient has no known allergies.    Review of Systems   Review of Systems  Skin:  Positive for wound.  All other systems reviewed and are negative.   Physical Exam Updated Vital Signs BP (!) 118/99 (BP Location: Right Arm)   Pulse 66   Temp 98 F (36.7 C)   Resp 18   Ht '5\' 6"'$  (1.676 m)   Wt 72.6 kg   SpO2 100%   BMI 25.83 kg/m  Physical Exam Vitals and nursing note reviewed.  Constitutional:  General: She is not in acute distress.    Appearance: She is well-developed.     Comments: GCS 15, ABC intact  HENT:     Head: Normocephalic.     Comments: 3 cm laceration to the posterior occiput, hemostatic Eyes:     Extraocular Movements: Extraocular movements intact.     Conjunctiva/sclera: Conjunctivae normal.     Pupils: Pupils are equal, round, and reactive to light.  Neck:     Comments: No midline tenderness to palpation of the cervical spine.  Range of motion intact Cardiovascular:     Rate and Rhythm: Normal rate and regular rhythm.  Pulmonary:     Effort: Pulmonary effort is normal. No respiratory distress.     Breath sounds: Normal breath sounds.  Chest:     Comments:  Clavicles stable nontender to AP compression.  Chest wall stable and nontender to AP and lateral compression. Abdominal:     Palpations: Abdomen is soft.     Tenderness: There is no abdominal tenderness.  Musculoskeletal:     Cervical back: Neck supple.     Comments: No midline tenderness to palpation of the thoracic or lumbar spine.  Extremities atraumatic with intact range of motion  Skin:    General: Skin is warm and dry.  Neurological:     Mental Status: She is alert.     Comments: Cranial nerves II through XII grossly intact.  Moving all 4 extremities spontaneously.  Sensation grossly intact all 4 extremities     ED Results / Procedures / Treatments   Labs (all labs ordered are listed, but only abnormal results are displayed) Labs Reviewed - No data to display  EKG None  Radiology CT Head Wo Contrast  Result Date: 05/24/2022 CLINICAL DATA:  [place] this morning.  Hit head. EXAM: CT HEAD WITHOUT CONTRAST CT CERVICAL SPINE WITHOUT CONTRAST TECHNIQUE: Multidetector CT imaging of the head and cervical spine was performed following the standard protocol without intravenous contrast. Multiplanar CT image reconstructions of the cervical spine were also generated. RADIATION DOSE REDUCTION: This exam was performed according to the departmental dose-optimization program which includes automated exposure control, adjustment of the mA and/or kV according to patient size and/or use of iterative reconstruction technique. COMPARISON:  04/24/2022 FINDINGS: CT HEAD FINDINGS Brain: Stable age related cerebral atrophy, ventriculomegaly and periventricular white matter disease. No extra-axial fluid collections are identified. No CT findings for acute hemispheric infarction or intracranial hemorrhage. No mass lesions. The brainstem and cerebellum are normal. Vascular: Stable vascular calcifications. No aneurysm hyperdense vessels. Skull: No acute skull fracture. Sinuses/Orbits: The paranasal sinuses and  mastoid air cells are clear. The globes are intact. Other: No scalp lesion or scalp hematoma. There is a laceration noted over the occipital region. No radiopaque foreign body. CT CERVICAL SPINE FINDINGS Alignment: Normal Skull base and vertebrae: No acute fracture. No primary bone lesion or focal pathologic process. Soft tissues and spinal canal: No prevertebral fluid or swelling. No visible canal hematoma. Disc levels: The spinal canal is generous. No canal stenosis or large disc protrusions. Upper chest: The lung apices are grossly clear. Other: No neck mass, adenopathy or hematoma. Scattered carotid artery calcifications are noted. IMPRESSION: 1. Stable age related cerebral atrophy, ventriculomegaly and periventricular white matter disease. 2. No acute intracranial findings or skull fracture. 3. Normal alignment of the cervical spine without acute fracture. Electronically Signed   By: Marijo Sanes M.D.   On: 05/24/2022 14:52   CT Cervical Spine Wo Contrast  Result Date:  05/24/2022 CLINICAL DATA:  [place] this morning.  Hit head. EXAM: CT HEAD WITHOUT CONTRAST CT CERVICAL SPINE WITHOUT CONTRAST TECHNIQUE: Multidetector CT imaging of the head and cervical spine was performed following the standard protocol without intravenous contrast. Multiplanar CT image reconstructions of the cervical spine were also generated. RADIATION DOSE REDUCTION: This exam was performed according to the departmental dose-optimization program which includes automated exposure control, adjustment of the mA and/or kV according to patient size and/or use of iterative reconstruction technique. COMPARISON:  04/24/2022 FINDINGS: CT HEAD FINDINGS Brain: Stable age related cerebral atrophy, ventriculomegaly and periventricular white matter disease. No extra-axial fluid collections are identified. No CT findings for acute hemispheric infarction or intracranial hemorrhage. No mass lesions. The brainstem and cerebellum are normal. Vascular: Stable  vascular calcifications. No aneurysm hyperdense vessels. Skull: No acute skull fracture. Sinuses/Orbits: The paranasal sinuses and mastoid air cells are clear. The globes are intact. Other: No scalp lesion or scalp hematoma. There is a laceration noted over the occipital region. No radiopaque foreign body. CT CERVICAL SPINE FINDINGS Alignment: Normal Skull base and vertebrae: No acute fracture. No primary bone lesion or focal pathologic process. Soft tissues and spinal canal: No prevertebral fluid or swelling. No visible canal hematoma. Disc levels: The spinal canal is generous. No canal stenosis or large disc protrusions. Upper chest: The lung apices are grossly clear. Other: No neck mass, adenopathy or hematoma. Scattered carotid artery calcifications are noted. IMPRESSION: 1. Stable age related cerebral atrophy, ventriculomegaly and periventricular white matter disease. 2. No acute intracranial findings or skull fracture. 3. Normal alignment of the cervical spine without acute fracture. Electronically Signed   By: Marijo Sanes M.D.   On: 05/24/2022 14:52    Procedures .Marland KitchenLaceration Repair  Date/Time: 05/24/2022 5:26 PM  Performed by: Regan Lemming, MD Authorized by: Regan Lemming, MD   Consent:    Consent obtained:  Verbal   Consent given by:  Patient   Risks discussed:  Infection, pain and poor wound healing Universal protocol:    Patient identity confirmed:  Verbally with patient and arm band Anesthesia:    Anesthesia method:  Topical application   Topical anesthetic:  LET Laceration details:    Location:  Scalp   Scalp location:  Occipital   Length (cm):  3   Depth (mm):  2 Pre-procedure details:    Preparation:  Imaging obtained to evaluate for foreign bodies Treatment:    Area cleansed with:  Soap and water   Amount of cleaning:  Standard Skin repair:    Repair method:  Staples   Number of staples:  7 Approximation:    Approximation:  Close Repair type:    Repair type:   Simple Post-procedure details:    Dressing:  Open (no dressing)   Procedure completion:  Tolerated     Medications Ordered in ED Medications  Tdap (BOOSTRIX) injection 0.5 mL (0.5 mLs Intramuscular Not Given 05/24/22 1703)  lidocaine-EPINEPHrine-tetracaine (LET) topical gel (3 mLs Topical Given 05/24/22 1701)    ED Course/ Medical Decision Making/ A&P                           Medical Decision Making Risk Prescription drug management.    87 year old female presenting to the emergency department after a fall at home.  The patient states that she was standing this morning when she briefly lost her balance and fell onto a dresser.  She landed on the back of her head sustaining a  3 cm laceration to the posterior head.  Bleeding is controlled.  She denies any loss of consciousness.  She is not on anticoagulation.  She denies any pain or lightheadedness.  She denies any vision changes or other neurologic deficits.  She denies any other injuries or complaints at this time.  She arrived to the emergency department GCS 15, ABC intact.  She is unclear of her last tetanus status but declines tetanus today.  On arrival, the patient was vitally stable.  Physical exam significant for 3 cm laceration to the posterior occiput, hemostatic.  No other traumatic injuries identified on physical exam.  Patient neurologically intact without deficit.  Low concern for CVA, TIA, syncope given lack of symptoms.  CT imaging of the head and cervical spine was performed and results are negative for acute traumatic injuries: IMPRESSION:  1. Stable age related cerebral atrophy, ventriculomegaly and  periventricular white matter disease.  2. No acute intracranial findings or skull fracture.  3. Normal alignment of the cervical spine without acute fracture.   Patient offered tetanus but declined update today.  The wound was cleared and irrigated by nursing staff bedside.  LET gel was applied.  7 staples were applied.   Patient was provided with wound care instructions.  Advised PCP follow-up for wound recheck and suture removal.  Return precautions provided.   Final Clinical Impression(s) / ED Diagnoses Final diagnoses:  Fall, initial encounter  Laceration of scalp, initial encounter    Rx / DC Orders ED Discharge Orders     None         Regan Lemming, MD 05/24/22 1730

## 2022-05-30 DIAGNOSIS — Z961 Presence of intraocular lens: Secondary | ICD-10-CM | POA: Diagnosis not present

## 2022-05-30 DIAGNOSIS — E119 Type 2 diabetes mellitus without complications: Secondary | ICD-10-CM | POA: Diagnosis not present

## 2022-05-31 DIAGNOSIS — W19XXXA Unspecified fall, initial encounter: Secondary | ICD-10-CM | POA: Diagnosis not present

## 2022-05-31 DIAGNOSIS — R2689 Other abnormalities of gait and mobility: Secondary | ICD-10-CM | POA: Diagnosis not present

## 2022-05-31 DIAGNOSIS — Z4802 Encounter for removal of sutures: Secondary | ICD-10-CM | POA: Diagnosis not present

## 2022-05-31 DIAGNOSIS — S0101XA Laceration without foreign body of scalp, initial encounter: Secondary | ICD-10-CM | POA: Diagnosis not present

## 2022-07-19 DIAGNOSIS — L570 Actinic keratosis: Secondary | ICD-10-CM | POA: Diagnosis not present

## 2022-07-19 DIAGNOSIS — D225 Melanocytic nevi of trunk: Secondary | ICD-10-CM | POA: Diagnosis not present

## 2022-07-19 DIAGNOSIS — Z85828 Personal history of other malignant neoplasm of skin: Secondary | ICD-10-CM | POA: Diagnosis not present

## 2022-07-19 DIAGNOSIS — D224 Melanocytic nevi of scalp and neck: Secondary | ICD-10-CM | POA: Diagnosis not present

## 2022-09-28 DIAGNOSIS — E1169 Type 2 diabetes mellitus with other specified complication: Secondary | ICD-10-CM | POA: Diagnosis not present

## 2022-09-28 DIAGNOSIS — M8589 Other specified disorders of bone density and structure, multiple sites: Secondary | ICD-10-CM | POA: Diagnosis not present

## 2022-09-28 DIAGNOSIS — M8580 Other specified disorders of bone density and structure, unspecified site: Secondary | ICD-10-CM | POA: Diagnosis not present

## 2022-09-28 DIAGNOSIS — E538 Deficiency of other specified B group vitamins: Secondary | ICD-10-CM | POA: Diagnosis not present

## 2022-09-28 DIAGNOSIS — R7989 Other specified abnormal findings of blood chemistry: Secondary | ICD-10-CM | POA: Diagnosis not present

## 2022-09-28 DIAGNOSIS — E785 Hyperlipidemia, unspecified: Secondary | ICD-10-CM | POA: Diagnosis not present

## 2022-09-28 DIAGNOSIS — I10 Essential (primary) hypertension: Secondary | ICD-10-CM | POA: Diagnosis not present

## 2022-09-28 DIAGNOSIS — E039 Hypothyroidism, unspecified: Secondary | ICD-10-CM | POA: Diagnosis not present

## 2022-09-28 DIAGNOSIS — D649 Anemia, unspecified: Secondary | ICD-10-CM | POA: Diagnosis not present

## 2022-10-05 DIAGNOSIS — I10 Essential (primary) hypertension: Secondary | ICD-10-CM | POA: Diagnosis not present

## 2022-10-05 DIAGNOSIS — I5189 Other ill-defined heart diseases: Secondary | ICD-10-CM | POA: Diagnosis not present

## 2022-10-05 DIAGNOSIS — R82998 Other abnormal findings in urine: Secondary | ICD-10-CM | POA: Diagnosis not present

## 2022-10-05 DIAGNOSIS — Z0000 Encounter for general adult medical examination without abnormal findings: Secondary | ICD-10-CM | POA: Diagnosis not present

## 2022-10-05 DIAGNOSIS — Z23 Encounter for immunization: Secondary | ICD-10-CM | POA: Diagnosis not present

## 2022-10-05 DIAGNOSIS — E1169 Type 2 diabetes mellitus with other specified complication: Secondary | ICD-10-CM | POA: Diagnosis not present

## 2022-10-05 DIAGNOSIS — I350 Nonrheumatic aortic (valve) stenosis: Secondary | ICD-10-CM | POA: Diagnosis not present

## 2023-05-02 DIAGNOSIS — R413 Other amnesia: Secondary | ICD-10-CM | POA: Diagnosis not present

## 2023-05-02 DIAGNOSIS — M25531 Pain in right wrist: Secondary | ICD-10-CM | POA: Diagnosis not present

## 2023-06-26 DIAGNOSIS — Z23 Encounter for immunization: Secondary | ICD-10-CM | POA: Diagnosis not present

## 2023-06-26 DIAGNOSIS — D649 Anemia, unspecified: Secondary | ICD-10-CM | POA: Diagnosis not present

## 2023-06-26 DIAGNOSIS — E039 Hypothyroidism, unspecified: Secondary | ICD-10-CM | POA: Diagnosis not present

## 2023-06-26 DIAGNOSIS — I10 Essential (primary) hypertension: Secondary | ICD-10-CM | POA: Diagnosis not present

## 2023-06-26 DIAGNOSIS — E1169 Type 2 diabetes mellitus with other specified complication: Secondary | ICD-10-CM | POA: Diagnosis not present

## 2023-06-29 ENCOUNTER — Other Ambulatory Visit (HOSPITAL_COMMUNITY): Payer: Self-pay | Admitting: Internal Medicine

## 2023-06-29 DIAGNOSIS — I350 Nonrheumatic aortic (valve) stenosis: Secondary | ICD-10-CM

## 2023-07-11 ENCOUNTER — Encounter (HOSPITAL_COMMUNITY): Payer: Self-pay | Admitting: Internal Medicine

## 2023-08-20 ENCOUNTER — Other Ambulatory Visit: Payer: Self-pay

## 2023-08-20 ENCOUNTER — Emergency Department (HOSPITAL_BASED_OUTPATIENT_CLINIC_OR_DEPARTMENT_OTHER): Payer: Medicare Other

## 2023-08-20 ENCOUNTER — Encounter (HOSPITAL_BASED_OUTPATIENT_CLINIC_OR_DEPARTMENT_OTHER): Payer: Self-pay

## 2023-08-20 ENCOUNTER — Emergency Department (HOSPITAL_BASED_OUTPATIENT_CLINIC_OR_DEPARTMENT_OTHER)
Admission: EM | Admit: 2023-08-20 | Discharge: 2023-08-20 | Disposition: A | Discharge status: Home / Self Care | Payer: Medicare Other | Attending: Emergency Medicine | Admitting: Emergency Medicine

## 2023-08-20 ENCOUNTER — Encounter: Payer: Self-pay | Admitting: Hematology & Oncology

## 2023-08-20 DIAGNOSIS — S0081XA Abrasion of other part of head, initial encounter: Secondary | ICD-10-CM | POA: Insufficient documentation

## 2023-08-20 DIAGNOSIS — R930 Abnormal findings on diagnostic imaging of skull and head, not elsewhere classified: Secondary | ICD-10-CM | POA: Diagnosis not present

## 2023-08-20 DIAGNOSIS — W19XXXA Unspecified fall, initial encounter: Secondary | ICD-10-CM

## 2023-08-20 DIAGNOSIS — W010XXA Fall on same level from slipping, tripping and stumbling without subsequent striking against object, initial encounter: Secondary | ICD-10-CM | POA: Diagnosis not present

## 2023-08-20 DIAGNOSIS — S50311A Abrasion of right elbow, initial encounter: Secondary | ICD-10-CM | POA: Diagnosis not present

## 2023-08-20 DIAGNOSIS — Z7984 Long term (current) use of oral hypoglycemic drugs: Secondary | ICD-10-CM | POA: Insufficient documentation

## 2023-08-20 DIAGNOSIS — R29890 Loss of height: Secondary | ICD-10-CM | POA: Diagnosis not present

## 2023-08-20 DIAGNOSIS — Z7982 Long term (current) use of aspirin: Secondary | ICD-10-CM | POA: Insufficient documentation

## 2023-08-20 DIAGNOSIS — R9082 White matter disease, unspecified: Secondary | ICD-10-CM | POA: Diagnosis not present

## 2023-08-20 DIAGNOSIS — Z79899 Other long term (current) drug therapy: Secondary | ICD-10-CM | POA: Diagnosis not present

## 2023-08-20 DIAGNOSIS — S0083XA Contusion of other part of head, initial encounter: Secondary | ICD-10-CM | POA: Diagnosis not present

## 2023-08-20 DIAGNOSIS — S0993XA Unspecified injury of face, initial encounter: Secondary | ICD-10-CM | POA: Diagnosis not present

## 2023-08-20 DIAGNOSIS — Z043 Encounter for examination and observation following other accident: Secondary | ICD-10-CM | POA: Diagnosis not present

## 2023-08-20 LAB — CBC WITH DIFFERENTIAL/PLATELET
Abs Immature Granulocytes: 0.01 10*3/uL (ref 0.00–0.07)
Basophils Absolute: 0 10*3/uL (ref 0.0–0.1)
Basophils Relative: 1 %
Eosinophils Absolute: 0.2 10*3/uL (ref 0.0–0.5)
Eosinophils Relative: 3 %
HCT: 35.6 % — ABNORMAL LOW (ref 36.0–46.0)
Hemoglobin: 11.7 g/dL — ABNORMAL LOW (ref 12.0–15.0)
Immature Granulocytes: 0 %
Lymphocytes Relative: 38 %
Lymphs Abs: 2.3 10*3/uL (ref 0.7–4.0)
MCH: 31 pg (ref 26.0–34.0)
MCHC: 32.9 g/dL (ref 30.0–36.0)
MCV: 94.2 fL (ref 80.0–100.0)
Monocytes Absolute: 0.5 10*3/uL (ref 0.1–1.0)
Monocytes Relative: 8 %
Neutro Abs: 3 10*3/uL (ref 1.7–7.7)
Neutrophils Relative %: 50 %
Platelets: 210 10*3/uL (ref 150–400)
RBC: 3.78 MIL/uL — ABNORMAL LOW (ref 3.87–5.11)
RDW: 12.9 % (ref 11.5–15.5)
WBC: 6 10*3/uL (ref 4.0–10.5)
nRBC: 0 % (ref 0.0–0.2)

## 2023-08-20 LAB — BASIC METABOLIC PANEL
Anion gap: 6 (ref 5–15)
BUN: 14 mg/dL (ref 8–23)
CO2: 30 mmol/L (ref 22–32)
Calcium: 10 mg/dL (ref 8.9–10.3)
Chloride: 101 mmol/L (ref 98–111)
Creatinine, Ser: 0.83 mg/dL (ref 0.44–1.00)
GFR, Estimated: >60 mL/min (ref >60)
Glucose, Bld: 130 mg/dL — ABNORMAL HIGH (ref 70–99)
Potassium: 3.7 mmol/L (ref 3.5–5.1)
Sodium: 137 mmol/L (ref 135–145)

## 2023-08-20 NOTE — Discharge Instructions (Addendum)
As discussed, your imaging is reassuring. Take Tylenol every 6-8 hours as needed for pain.  Ice your eye to help with the swelling.  Follow-up with your primary care provider in the next 3 to 5 days for reevaluation of your symptoms.  Get help right away if you: Lose consciousness or have trouble moving after a fall. Have a fall that causes a head injury.

## 2023-08-20 NOTE — ED Triage Notes (Signed)
Pt reports mechanical fall yesterday afternoon, concern for increased bruising. States that son "wants me to get a work up like I did last time I fell earlier in the year.   Abrasion/skin tear on R elbow, significant bruising to R periorbital area, abrasion to R forehead.   Pt A&O4, ambulatory to triage

## 2023-08-20 NOTE — ED Provider Notes (Signed)
[place] EMERGENCY DEPARTMENT AT [facility] Provider Note   CSN: [id number] Arrival date & time: 08/20/23  1549     History  Chief Complaint  Patient presents with   Meghan Santos is a 88 y.o. female with a history of anemia, retention, and GERD who presents the ED today after a fall.  Patient reports she tripped and fell yesterday, landing on her face.  She denies any loss of consciousness.  She takes aspirin daily.  No headaches, weakness, vision changes, or inability to walk.  Patient is here to get evaluated at her son's request. Patient denies any pain at this time despite having right periorbital bruising, an abrasion on both her right forehead and right elbow.  No additional complaints or concerns at this time.    Home Medications Prior to Admission medications   Medication Sig Start Date End Date Taking? Authorizing Provider  aspirin 81 MG tablet Take 81 mg by mouth.    [provider]  benazepril-hydrochlorthiazide (LOTENSIN HCT) 10-12.5 MG per tablet Take 1 tablet by mouth daily. 04/13/12   [provider]  Calcium Carbonate (CALCIUM 600 PO) Take 1 tablet by mouth daily.    [provider]  Cholecalciferol (VITAMIN D) 2000 UNITS tablet Take 2,000 Units by mouth daily.    [provider]  Coenzyme Q10 (CO Q-10) 100 MG CAPS Take 300 mg by mouth every morning.     [provider]  Cyanocobalamin (VITAMIN B 12 PO) Take 1,000 mg by mouth every morning.    [provider]  FLUoxetine (PROZAC) 40 MG capsule Take 40 mg by mouth daily. 04/13/21   [provider]  furosemide (LASIX) 40 MG tablet TAKE 2 TABLETS(80 MG) BY MOUTH DAILY Patient taking differently: 10 mg. 11/27/19   Azalee Course, PA  levothyroxine (SYNTHROID, LEVOTHROID) 125 MCG tablet 125 mcg daily. Specialized dosing 04/12/12   [provider]  LORazepam (ATIVAN) 1 MG tablet Take 1 mg by mouth at bedtime as needed. 12/08/19    [provider]  metFORMIN (GLUCOPHAGE) 1000 MG tablet TK 1 T PO D AT SUPPER TIME 06/06/17   [provider]  Multiple Vitamin (MULTI-VITAMIN DAILY PO) Take by mouth every morning.    [provider]  mupirocin ointment (BACTROBAN) 2 % Apply topically 3 (three) times daily. 05/18/21   [provider]  potassium chloride SA (KLOR-CON) 20 MEQ tablet TAKE 1 TABLET(20 MEQ) BY MOUTH DAILY 12/23/19   Croitoru, Mihai, MD  rosuvastatin (CRESTOR) 10 MG tablet 10 mg daily.  02/19/12   [provider]  sitaGLIPtin (JANUVIA) 100 MG tablet Take 100 mg by mouth daily. Unknown dose     [provider]      Allergies    Patient has no known allergies.    Review of Systems   Review of Systems  Skin:  Positive for wound.  All other systems reviewed and are negative.   Physical Exam Updated Vital Signs BP 137/64   Pulse (!) 56   Temp 98.9 F (37.2 C)   Resp 20   SpO2 98%  Physical Exam Vitals and nursing note reviewed.  Constitutional:      General: She is not in acute distress.    Appearance: Normal appearance.  HENT:     Head: Normocephalic.     Comments: Swelling and bruising present around the right eye.  Abrasion at forehead above the lateral right eyebrow.  No Battle sign.  Mouth/Throat:     Mouth: Mucous membranes are moist.  Eyes:     Conjunctiva/sclera: Conjunctivae normal.     Pupils: Pupils are equal, round, and reactive to light.  Cardiovascular:     Rate and Rhythm: Normal rate and regular rhythm.     Pulses: Normal pulses.     Heart sounds: Normal heart sounds.  Pulmonary:     Effort: Pulmonary effort is normal.     Breath sounds: Normal breath sounds.  Abdominal:     Palpations: Abdomen is soft.     Tenderness: There is no abdominal tenderness.  Musculoskeletal:        General: No tenderness. Normal range of motion.     Cervical back: Normal range of motion. No rigidity or tenderness.     Comments: No tenderness to  palpation of the cervical, thoracic, or lumbar spine.  No step-off or deformity.  Strength, sensation, and range of motion of upper and lower extremities intact bilaterally.  Skin:    General: Skin is warm and dry.     Findings: Lesion present. No rash.     Comments: Abrasion at the right upper forehead above the eyebrow as well as the right lateral elbow.  Neurological:     General: No focal deficit present.     Mental Status: She is alert.     Motor: No weakness.  Psychiatric:        Mood and Affect: Mood normal.        Behavior: Behavior normal.    ED Results / Procedures / Treatments   Labs (all labs ordered are listed, but only abnormal results are displayed) Labs Reviewed  CBC WITH DIFFERENTIAL/PLATELET - Abnormal; Notable for the following components:      Result Value   RBC 3.78 (*)    Hemoglobin 11.7 (*)    HCT 35.6 (*)    All other components within normal limits  BASIC METABOLIC PANEL - Abnormal; Notable for the following components:   Glucose, Bld 130 (*)    All other components within normal limits    EKG None  Radiology CT Maxillofacial Wo Contrast  Result Date: 08/20/2023 CLINICAL DATA:  Fall yesterday, bruising EXAM: CT HEAD WITHOUT CONTRAST CT MAXILLOFACIAL WITHOUT CONTRAST CT CERVICAL SPINE WITHOUT CONTRAST TECHNIQUE: Multidetector CT imaging of the head, cervical spine, and maxillofacial structures were performed using the standard protocol without intravenous contrast. Multiplanar CT image reconstructions of the cervical spine and maxillofacial structures were also generated. RADIATION DOSE REDUCTION: This exam was performed according to the departmental dose-optimization program which includes automated exposure control, adjustment of the mA and/or kV according to patient size and/or use of iterative reconstruction technique. COMPARISON:  05/24/2022 FINDINGS: CT HEAD FINDINGS Brain: No evidence of acute infarction, hemorrhage, hydrocephalus, extra-axial  collection or mass lesion/mass effect. Periventricular white matter hypodensity. Vascular: No hyperdense vessel or unexpected calcification. CT FACIAL BONES FINDINGS Skull: Normal. Negative for fracture or focal lesion. Facial bones: No displaced fractures or dislocations. Sinuses/Orbits: No acute finding. Other: Soft tissue contusion of the right forehead and cheek (series 2, image 79, 57). CT CERVICAL SPINE FINDINGS Alignment: Normal. Skull base and vertebrae: Osteopenia. No acute fracture of the cervical vertebra. New, although age indeterminate superior endplate wedge deformity of T2, with 30% anterior height loss (series 7, image 41). No primary bone lesion or focal pathologic process. Soft tissues and spinal canal: No prevertebral fluid or swelling. No visible canal hematoma. Disc levels: Mild multilevel disc space height loss and osteophytosis. Upper  chest: Negative. Other: None. IMPRESSION: 1. No acute intracranial pathology. 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 2. No displaced fractures or dislocations of the facial bones. 3. Soft tissue contusion of the right forehead and cheek. 4. No acute fracture or static subluxation of the cervical spine. 5. New, although age indeterminate superior endplate wedge deformity of T2, with 30% anterior height loss. Correlate for acutely referable pain and point tenderness. Electronically Signed   By: Jearld Lesch M.D.   On: 08/20/2023 18:00   CT Head Wo Contrast  Result Date: 08/20/2023 CLINICAL DATA:  Fall yesterday, bruising EXAM: CT HEAD WITHOUT CONTRAST CT MAXILLOFACIAL WITHOUT CONTRAST CT CERVICAL SPINE WITHOUT CONTRAST TECHNIQUE: Multidetector CT imaging of the head, cervical spine, and maxillofacial structures were performed using the standard protocol without intravenous contrast. Multiplanar CT image reconstructions of the cervical spine and maxillofacial structures were also generated. RADIATION DOSE REDUCTION: This exam  was performed according to the departmental dose-optimization program which includes automated exposure control, adjustment of the mA and/or kV according to patient size and/or use of iterative reconstruction technique. COMPARISON:  05/24/2022 FINDINGS: CT HEAD FINDINGS Brain: No evidence of acute infarction, hemorrhage, hydrocephalus, extra-axial collection or mass lesion/mass effect. Periventricular white matter hypodensity. Vascular: No hyperdense vessel or unexpected calcification. CT FACIAL BONES FINDINGS Skull: Normal. Negative for fracture or focal lesion. Facial bones: No displaced fractures or dislocations. Sinuses/Orbits: No acute finding. Other: Soft tissue contusion of the right forehead and cheek (series 2, image 79, 57). CT CERVICAL SPINE FINDINGS Alignment: Normal. Skull base and vertebrae: Osteopenia. No acute fracture of the cervical vertebra. New, although age indeterminate superior endplate wedge deformity of T2, with 30% anterior height loss (series 7, image 41). No primary bone lesion or focal pathologic process. Soft tissues and spinal canal: No prevertebral fluid or swelling. No visible canal hematoma. Disc levels: Mild multilevel disc space height loss and osteophytosis. Upper chest: Negative. Other: None. IMPRESSION: 1. No acute intracranial pathology. 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 2. No displaced fractures or dislocations of the facial bones. 3. Soft tissue contusion of the right forehead and cheek. 4. No acute fracture or static subluxation of the cervical spine. 5. New, although age indeterminate superior endplate wedge deformity of T2, with 30% anterior height loss. Correlate for acutely referable pain and point tenderness. Electronically Signed   By: Jearld Lesch M.D.   On: 08/20/2023 18:00   CT Cervical Spine Wo Contrast  Result Date: 08/20/2023 CLINICAL DATA:  Fall yesterday, bruising EXAM: CT HEAD WITHOUT CONTRAST CT MAXILLOFACIAL  WITHOUT CONTRAST CT CERVICAL SPINE WITHOUT CONTRAST TECHNIQUE: Multidetector CT imaging of the head, cervical spine, and maxillofacial structures were performed using the standard protocol without intravenous contrast. Multiplanar CT image reconstructions of the cervical spine and maxillofacial structures were also generated. RADIATION DOSE REDUCTION: This exam was performed according to the departmental dose-optimization program which includes automated exposure control, adjustment of the mA and/or kV according to patient size and/or use of iterative reconstruction technique. COMPARISON:  05/24/2022 FINDINGS: CT HEAD FINDINGS Brain: No evidence of acute infarction, hemorrhage, hydrocephalus, extra-axial collection or mass lesion/mass effect. Periventricular white matter hypodensity. Vascular: No hyperdense vessel or unexpected calcification. CT FACIAL BONES FINDINGS Skull: Normal. Negative for fracture or focal lesion. Facial bones: No displaced fractures or dislocations. Sinuses/Orbits: No acute finding. Other: Soft tissue contusion of the right forehead and cheek (series 2, image 79, 57). CT CERVICAL SPINE FINDINGS Alignment: Normal. Skull base and vertebrae: Osteopenia.  No acute fracture of the cervical vertebra. New, although age indeterminate superior endplate wedge deformity of T2, with 30% anterior height loss (series 7, image 41). No primary bone lesion or focal pathologic process. Soft tissues and spinal canal: No prevertebral fluid or swelling. No visible canal hematoma. Disc levels: Mild multilevel disc space height loss and osteophytosis. Upper chest: Negative. Other: None. IMPRESSION: 1. No acute intracranial pathology. 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 2. No displaced fractures or dislocations of the facial bones. 3. Soft tissue contusion of the right forehead and cheek. 4. No acute fracture or static subluxation of the cervical spine. 5. New, although age  indeterminate superior endplate wedge deformity of T2, with 30% anterior height loss. Correlate for acutely referable pain and point tenderness. Electronically Signed   By: Jearld Lesch M.D.   On: 08/20/2023 18:00    Procedures Procedures    Medications Ordered in ED Medications - No data to display  ED Course/ Medical Decision Making/ A&P                                 Medical Decision Making Amount and/or Complexity of Data Reviewed Labs: ordered. Radiology: ordered.   This patient presents to the ED for concern of fall, this involves an extensive number of treatment options, and is a complaint that carries with it a high risk of complications and morbidity.   Differential diagnosis includes: SAH, SDH, concussion, abrasion, fracture, dislocation, contusion, etc.   Comorbidities  See HPI above   Additional History  Additional history obtained from prior primary care records.   Lab Tests  I ordered and personally interpreted labs.  The pertinent results include:   CMP and CBC are within normal limits for patient - no acute electro abnormality, AKI, anemia, or infection.   Imaging Studies  I ordered imaging studies including CT head, maxillofacial, and cervical spine.  I independently visualized and interpreted imaging which showed: No acute intracranial or osseous abnormalities. I agree with the radiologist interpretation   Problem List / ED Course / Critical Interventions / Medication Management  Fall I cleaned patient's right elbow abrasion and applied bandage. I have reviewed the patients home medicines and have made adjustments as needed   Social Determinants of Health  Physical activity   Test / Admission - Considered  Discussed findings with patient.  All questions were answered. She is hemodynamically stable and safe for discharge home. Return precautions given.       Final Clinical Impression(s) / ED Diagnoses Final diagnoses:  Fall,  initial encounter  Facial abrasion, initial encounter    Rx / DC Orders ED Discharge Orders     None         Maxwell Marion, PA-C 08/20/23 1925    Virgina Norfolk, DO 08/20/23 2109

## 2023-08-28 DIAGNOSIS — S0501XA Injury of conjunctiva and corneal abrasion without foreign body, right eye, initial encounter: Secondary | ICD-10-CM | POA: Diagnosis not present

## 2023-08-28 DIAGNOSIS — R296 Repeated falls: Secondary | ICD-10-CM | POA: Diagnosis not present

## 2023-12-06 DIAGNOSIS — D649 Anemia, unspecified: Secondary | ICD-10-CM | POA: Diagnosis not present

## 2023-12-06 DIAGNOSIS — E785 Hyperlipidemia, unspecified: Secondary | ICD-10-CM | POA: Diagnosis not present

## 2023-12-06 DIAGNOSIS — E039 Hypothyroidism, unspecified: Secondary | ICD-10-CM | POA: Diagnosis not present

## 2023-12-06 DIAGNOSIS — E1169 Type 2 diabetes mellitus with other specified complication: Secondary | ICD-10-CM | POA: Diagnosis not present

## 2023-12-06 DIAGNOSIS — M8580 Other specified disorders of bone density and structure, unspecified site: Secondary | ICD-10-CM | POA: Diagnosis not present

## 2023-12-13 DIAGNOSIS — R82998 Other abnormal findings in urine: Secondary | ICD-10-CM | POA: Diagnosis not present

## 2023-12-13 DIAGNOSIS — E1169 Type 2 diabetes mellitus with other specified complication: Secondary | ICD-10-CM | POA: Diagnosis not present

## 2023-12-13 DIAGNOSIS — Z0000 Encounter for general adult medical examination without abnormal findings: Secondary | ICD-10-CM | POA: Diagnosis not present

## 2023-12-13 DIAGNOSIS — I119 Hypertensive heart disease without heart failure: Secondary | ICD-10-CM | POA: Diagnosis not present

## 2024-01-03 ENCOUNTER — Telehealth: Payer: Self-pay | Admitting: Cardiovascular Disease

## 2024-01-03 NOTE — Telephone Encounter (Signed)
 Patient called to request orders to have an echocardiogram.  Patient stated her new number is [phone].

## 2024-01-03 NOTE — Telephone Encounter (Signed)
 Returned call to patient at # [phone] wrong #Called patient at # [phone] left message on voice mail to call back.

## 2024-01-04 NOTE — Telephone Encounter (Signed)
.  Spoke with pt, she feels like she needs an echo. She was supposed to get one a year ago but her husband got sick and she never had it done. She is having episodes of her arm and hands getting cold and hurting. Explained to the patient since she has not been seen since 2022, we would need to see her back in the office. Follow up, next available scheduled.

## 2024-01-07 ENCOUNTER — Encounter (HOSPITAL_COMMUNITY): Payer: Self-pay | Admitting: Internal Medicine

## 2024-01-07 DIAGNOSIS — I350 Nonrheumatic aortic (valve) stenosis: Secondary | ICD-10-CM

## 2024-01-08 ENCOUNTER — Ambulatory Visit (HOSPITAL_COMMUNITY): Attending: Internal Medicine

## 2024-01-08 DIAGNOSIS — I350 Nonrheumatic aortic (valve) stenosis: Secondary | ICD-10-CM | POA: Diagnosis not present

## 2024-01-08 LAB — ECHOCARDIOGRAM COMPLETE
AR max vel: 1.3 cm2
AV Area VTI: 1.25 cm2
AV Area mean vel: 1.24 cm2
AV Mean grad: 14.8 mmHg
AV Peak grad: 25.3 mmHg
Ao pk vel: 2.52 m/s
Area-P 1/2: 3.48 cm2
P 1/2 time: 639 ms
S' Lateral: 1.7 cm

## 2024-01-17 ENCOUNTER — Other Ambulatory Visit: Payer: Self-pay | Admitting: Internal Medicine

## 2024-01-17 DIAGNOSIS — Z1231 Encounter for screening mammogram for malignant neoplasm of breast: Secondary | ICD-10-CM

## 2024-01-22 ENCOUNTER — Ambulatory Visit: Admitting: Cardiovascular Disease

## 2024-01-22 ENCOUNTER — Other Ambulatory Visit: Payer: Self-pay | Admitting: Registered Nurse

## 2024-01-22 DIAGNOSIS — I10 Essential (primary) hypertension: Secondary | ICD-10-CM | POA: Diagnosis not present

## 2024-01-22 DIAGNOSIS — C50912 Malignant neoplasm of unspecified site of left female breast: Secondary | ICD-10-CM

## 2024-01-22 DIAGNOSIS — N6321 Unspecified lump in the left breast, upper outer quadrant: Secondary | ICD-10-CM

## 2024-01-24 ENCOUNTER — Ambulatory Visit
Admission: RE | Admit: 2024-01-24 | Discharge: 2024-01-24 | Disposition: A | Discharge status: Home / Self Care | Source: Ambulatory Visit | Attending: Registered Nurse | Admitting: Registered Nurse

## 2024-01-24 DIAGNOSIS — N6321 Unspecified lump in the left breast, upper outer quadrant: Secondary | ICD-10-CM

## 2024-01-24 DIAGNOSIS — C50912 Malignant neoplasm of unspecified site of left female breast: Secondary | ICD-10-CM

## 2024-01-24 DIAGNOSIS — N6323 Unspecified lump in the left breast, lower outer quadrant: Secondary | ICD-10-CM | POA: Diagnosis not present

## 2024-01-24 DIAGNOSIS — Z853 Personal history of malignant neoplasm of breast: Secondary | ICD-10-CM | POA: Diagnosis not present

## 2024-02-06 ENCOUNTER — Encounter

## 2024-02-06 ENCOUNTER — Other Ambulatory Visit

## 2024-08-08 DIAGNOSIS — I350 Nonrheumatic aortic (valve) stenosis: Secondary | ICD-10-CM | POA: Diagnosis not present

## 2024-08-08 DIAGNOSIS — E1169 Type 2 diabetes mellitus with other specified complication: Secondary | ICD-10-CM | POA: Diagnosis not present
